# Patient Record
Sex: Female | Born: 1950 | Race: Black or African American | Hispanic: No | Smoking: Former smoker
Health system: Southern US, Community
[De-identification: ages and names within clinical notes are randomized; demographics above are authoritative.]

## PROBLEM LIST (undated history)

## (undated) ENCOUNTER — Inpatient Hospital Stay: Payer: Self-pay

## (undated) DIAGNOSIS — I272 Pulmonary hypertension, unspecified: Secondary | ICD-10-CM

## (undated) DIAGNOSIS — R002 Palpitations: Secondary | ICD-10-CM

## (undated) DIAGNOSIS — I10 Essential (primary) hypertension: Secondary | ICD-10-CM

## (undated) HISTORY — DX: Pulmonary hypertension, unspecified: I27.20

## (undated) HISTORY — DX: Essential (primary) hypertension: I10

## (undated) HISTORY — PX: OTHER SURGICAL HISTORY: SHX169

## (undated) HISTORY — DX: Palpitations: R00.2

---

## 2010-04-29 ENCOUNTER — Ambulatory Visit (INDEPENDENT_AMBULATORY_CARE_PROVIDER_SITE_OTHER): Payer: BC Managed Care – PPO | Admitting: Emergency Medicine

## 2010-04-29 DIAGNOSIS — R079 Chest pain, unspecified: Secondary | ICD-10-CM

## 2010-04-30 ENCOUNTER — Encounter: Payer: Self-pay | Admitting: Emergency Medicine

## 2010-04-30 DIAGNOSIS — R002 Palpitations: Secondary | ICD-10-CM | POA: Insufficient documentation

## 2010-04-30 LAB — CONVERTED CEMR LAB
ALT: 26 units/L (ref 0–35)
AST: 24 units/L (ref 0–37)
Albumin: 4 g/dL (ref 3.5–5.2)
Alkaline Phosphatase: 82 units/L (ref 39–117)
BUN: 12 mg/dL (ref 6–23)
Basophils Absolute: 0.1 10*3/uL (ref 0.0–0.1)
Basophils Relative: 1 % (ref 0–1)
CO2: 27 meq/L (ref 19–32)
Calcium: 9.7 mg/dL (ref 8.4–10.5)
Chloride: 106 meq/L (ref 96–112)
Creatinine, Ser: 0.8 mg/dL (ref 0.40–1.20)
Eosinophils Absolute: 0.3 10*3/uL (ref 0.0–0.7)
Eosinophils Relative: 3 % (ref 0–5)
Glucose, Bld: 93 mg/dL (ref 70–99)
HCT: 43.3 % (ref 36.0–46.0)
Hemoglobin: 13.9 g/dL (ref 12.0–15.0)
Lymphocytes Relative: 23 % (ref 12–46)
Lymphs Abs: 2.1 10*3/uL (ref 0.7–4.0)
MCHC: 32.1 g/dL (ref 30.0–36.0)
MCV: 88.9 fL (ref 78.0–100.0)
Monocytes Absolute: 0.6 10*3/uL (ref 0.1–1.0)
Monocytes Relative: 6 % (ref 3–12)
Neutro Abs: 6.3 10*3/uL (ref 1.7–7.7)
Neutrophils Relative %: 67 % (ref 43–77)
Platelets: 268 10*3/uL (ref 150–400)
Potassium: 5.2 meq/L (ref 3.5–5.3)
RBC: 4.87 M/uL (ref 3.87–5.11)
RDW: 13.6 % (ref 11.5–15.5)
Sodium: 142 meq/L (ref 135–145)
TSH: 2.62 microintl units/mL (ref 0.350–4.500)
Total Bilirubin: 0.3 mg/dL (ref 0.3–1.2)
Total Protein: 7.6 g/dL (ref 6.0–8.3)
WBC: 9.4 10*3/uL (ref 4.0–10.5)

## 2010-05-01 ENCOUNTER — Encounter: Payer: Self-pay | Admitting: Emergency Medicine

## 2010-05-09 NOTE — Progress Notes (Signed)
Summary: WORK NOTE  Office Visit   Imported By: Haskell Riling 05/01/2010 09:30:00  _____________________________________________________________________  External Attachment:    Type:   Image     Comment:   External Document

## 2010-05-09 NOTE — Progress Notes (Signed)
Summary: Office Visit  Office Visit   Imported By: Haskell Riling 05/01/2010 09:29:23  _____________________________________________________________________  External Attachment:    Type:   Image     Comment:   External Document

## 2010-05-09 NOTE — Progress Notes (Signed)
Summary: PRESCRIPTION  Office Visit   Imported By: Haskell Riling 05/01/2010 09:29:43  _____________________________________________________________________  External Attachment:    Type:   Image     Comment:   External Document

## 2010-05-14 DIAGNOSIS — I10 Essential (primary) hypertension: Secondary | ICD-10-CM | POA: Insufficient documentation

## 2010-05-15 ENCOUNTER — Ambulatory Visit: Payer: BC Managed Care – PPO | Admitting: Cardiology

## 2010-05-15 ENCOUNTER — Ambulatory Visit (INDEPENDENT_AMBULATORY_CARE_PROVIDER_SITE_OTHER): Payer: BC Managed Care – PPO | Admitting: Family Medicine

## 2010-05-15 ENCOUNTER — Encounter: Payer: Self-pay | Admitting: Family Medicine

## 2010-05-15 DIAGNOSIS — M940 Chondrocostal junction syndrome [Tietze]: Secondary | ICD-10-CM

## 2010-05-15 DIAGNOSIS — J069 Acute upper respiratory infection, unspecified: Secondary | ICD-10-CM

## 2010-05-15 NOTE — Assessment & Plan Note (Signed)
Summary: CHEST FLUTTER/WSE  History of Present Illness Chief Complaint: Palpitations History of Present Illness: EMR WAS NOT FUNCTIONING ON THE DAY PT SEEN.  PLEASE SEE HANDWRITTEN NOTES FOR HX,EXAM,A&P. SCANNED INTO EMR CHART.   Assessment New Problems: PALPITATIONS (ICD-785.1)  EKG WNL.  Also, pt has elevated BP,with relatively high pulse, will likely benefit from a beta blocker. Need to r/o metabolic cause.  Needs referral to cardiologist.  Patient Education: healthy eating habits. DASH diet.  Plan New Medications/Changes: ATENOLOL 25 MG TABS (ATENOLOL) 1 by mouth bid  #20 x 0, 05/05/2010, Lajean Manes MD  New Orders: T-CBC w/Diff [[id number]] T-CMP with Estimated GFR [[address]] T-TSH [44034-74259] New Patient Level IV [99204] Planning Comments:   See handwritten notes, scanned into chart   The patient and/or caregiver has been counseled thoroughly with regard to medications prescribed including dosage, schedule, interactions, rationale for use, and possible side effects and they verbalize understanding.  Diagnoses and expected course of recovery discussed and will return if not improved as expected or if the condition worsens. Patient and/or caregiver verbalized understanding.  Prescriptions: ATENOLOL 25 MG TABS (ATENOLOL) 1 by mouth bid  #20 x 0   Entered and Authorized by:   Lajean Manes MD   Signed by:   Lajean Manes MD on 05/05/2010   Method used:   Electronically to        [facility] [phone]* (retail)       [address] [place], [place]  [address]       Ph: [phone] or [phone]       Fax: [phone]   RxID:   [id number]   Orders Added: 1)  T-CBC w/Diff [[id number]] 2)  T-CMP with Estimated GFR [[address]] 3)  T-TSH [10626-94854] 4)  New Patient Level IV [62703]   Patient is scheduled to see Dr. Jens Som on May 15, 2010 @ 3:30 @ [facility] location. Patient notified. Lajean Saver RN  April 30, 2010 9:29 AM   CBC,  CMP, TSH Reviewed by me. All WNL.  I called pt at home number today to see how she was doing. Pt informed of normal lab results. She is taking the Atenolol 25 mg two times a day as prescribed without side-effects. The frequency of palpitations has decreased, although still getting them occasionally at rest. Denies chest pain. She is aware of Cardiology appt with Dr. Jens Som on May 15, 2010 @ 3:30 @ [facility] location. I advised her to keep that appt.  In the meantime, continue Atenolol 25 mg two times a day. (Today, I erx'd a RF for # 20 to last until appt with Dr. Jens Som). Advised to have BP checked where she works, and bring in a log of her BP's to the appt with Dr. Jens Som. Lajean Manes, MD (Urgent Care Physician at [facility]) - Februrary 12, 2012

## 2010-05-20 ENCOUNTER — Encounter: Payer: Self-pay | Admitting: Family Medicine

## 2010-05-20 ENCOUNTER — Ambulatory Visit
Admission: RE | Admit: 2010-05-20 | Discharge: 2010-05-20 | Disposition: A | Discharge status: Home / Self Care | Payer: BC Managed Care – PPO | Source: Ambulatory Visit | Attending: Family Medicine | Admitting: Family Medicine

## 2010-05-20 ENCOUNTER — Ambulatory Visit (INDEPENDENT_AMBULATORY_CARE_PROVIDER_SITE_OTHER): Payer: BC Managed Care – PPO | Admitting: Family Medicine

## 2010-05-20 ENCOUNTER — Other Ambulatory Visit: Payer: Self-pay | Admitting: Family Medicine

## 2010-05-20 DIAGNOSIS — R0609 Other forms of dyspnea: Secondary | ICD-10-CM

## 2010-05-20 DIAGNOSIS — R0989 Other specified symptoms and signs involving the circulatory and respiratory systems: Secondary | ICD-10-CM

## 2010-05-20 DIAGNOSIS — J069 Acute upper respiratory infection, unspecified: Secondary | ICD-10-CM

## 2010-05-21 NOTE — Letter (Signed)
Summary: Out of Work  [facility]  [address]   [place], [place] [address]   Phone: [phone]  Fax: [phone]    May 15, 2010   Employee:  [name]    To Whom It May Concern:   For Medical reasons, please excuse the above named employee from work today and tomorrow.  If you need additional information, please feel free to contact our office.         Sincerely,    Donna Christen MD

## 2010-05-21 NOTE — Assessment & Plan Note (Signed)
Summary: FEVER/SORE THROAT/SOB/HEADACHE rm 3   Vital Signs:  Patient Profile:   60 Years Old Female CC:      SOB, fever, HA Height:     68 inches Weight:      275 pounds O2 Sat:      91 % O2 treatment:    Room Air Temp:     100 degrees F oral Pulse rate:   90 / minute Resp:     24 per minute BP sitting:   189 / 89  (left arm) Cuff size:   large  Vitals Entered By: Clemens Catholic LPN (May 15, 2010 10:08 AM)                  Updated Prior Medication List: ATENOLOL 25 MG TABS (ATENOLOL) 1 by mouth bid  Current Allergies: No known allergies History of Present Illness Chief Complaint: SOB, fever, HA History of Present Illness:  Subjective: Patient complains of sore throat that started last week then resolved.  She then developed tightness in her anterior chest followed by a cough, now worse at night and non-productive.  She has had pneumonia in the distant past.   No pleuritic pain No wheezing + nasal congestion ? post-nasal drainage No sinus pain/pressure No itchy/red eyes No earache No hemoptysis + SOB with activity + fever/chills to 100.4 today No nausea No vomiting No abdominal pain No diarrhea No skin rashes + fatigue No myalgias + headache Used OTC meds without relief   REVIEW OF SYSTEMS Constitutional Symptoms       Complains of fever, chills, and night sweats.     Denies weight loss, weight gain, and fatigue.  Eyes       Complains of glasses.      Denies change in vision, eye pain, eye discharge, contact lenses, and eye surgery. Ear/Nose/Throat/Mouth       Complains of frequent runny nose and hoarseness.      Denies hearing loss/aids, change in hearing, ear pain, ear discharge, dizziness, frequent nose bleeds, sinus problems, sore throat, and tooth pain or bleeding.  Respiratory       Complains of dry cough, wheezing, and shortness of breath.      Denies productive cough, asthma, bronchitis, and emphysema/COPD.  Cardiovascular  Complains of chest pain and tires easily with exhertion.      Denies murmurs.    Gastrointestinal       Denies stomach pain, nausea/vomiting, diarrhea, constipation, blood in bowel movements, and indigestion.      Comments: vommiting Genitourniary       Denies painful urination, kidney stones, and loss of urinary control. Neurological       Denies paralysis, seizures, and fainting/blackouts. Musculoskeletal       Denies muscle pain, joint pain, joint stiffness, decreased range of motion, redness, swelling, muscle weakness, and gout.  Skin       Denies bruising, unusual mles/lumps or sores, and hair/skin or nail changes.  Psych       Denies mood changes, temper/anger issues, anxiety/stress, speech problems, depression, and sleep problems. Other Comments: pt c/o fever, SOB, productive cough, chills, and fatigue x 1wk. she states that she was feeling better over the wkend and came back again on Monday.   Past History:  Past Medical History: Reviewed history from 05/14/2010 and no changes required. HYPERTENSION  PALPITATIONS  Past Surgical History: none  Family History: Reviewed history from 05/14/2010 and no changes required. Sisiter : murmur  Mother: enlarged heart  Social History: Reviewed  history and no changes required. Never Smoked Alcohol use-no Drug use-no Smoking Status:  never Drug Use:  no   Objective:  No acute distress  Eyes:  Pupils are equal, round, and reactive to light and accomdation.  Extraocular movement is intact.  Conjunctivae are not inflamed.  Ears:  Canals normal.  Tympanic membranes normal.   Nose:  Normal septum.  Normal turbinates, mildly congested.   No sinus tenderness present.  Pharynx:  Normal  Neck:  Supple.   Tender shotty posterior nodes are palpated bilaterally.  Lungs:  Clear to auscultation.  Breath sounds are equal.  Chest:  Distinct tenderness over sternum Heart:  Regular rate and rhythm without murmurs, rubs, or gallops.    Abdomen:  Nontender without masses or hepatosplenomegaly.  Bowel sounds are present.  No CVA or flank tenderness.  Extremities:  No edema.  Skin:  no rash Assessment New Problems: COSTOCHONDRITIS, ACUTE (ICD-733.6) UPPER RESPIRATORY INFECTION, ACUTE (ICD-465.9)  URI WITH COSTOCHONDRITIS  Plan New Medications/Changes: BENZONATATE 200 MG CAPS (BENZONATATE) One by mouth hs as needed cough  #12 x 0, 05/15/2010, Donna Christen MD AMOXICILLIN 875 MG TABS (AMOXICILLIN) One by mouth two times a day  #20 x 0, 05/15/2010, Donna Christen MD  New Orders: Pulse Oximetry (single measurment) [94760] Est. Patient Level III [16109] Planning Comments:   With history of pneumonia will begin amoxicillin Begin expectorant, topical decongestant, cough suppressant at bedtime.  Increase fluid intake.  Begin ibuprofen for chest discomfort Followup with PCP if not improving 7 to 10 days   The patient and/or caregiver has been counseled thoroughly with regard to medications prescribed including dosage, schedule, interactions, rationale for use, and possible side effects and they verbalize understanding.  Diagnoses and expected course of recovery discussed and will return if not improved as expected or if the condition worsens. Patient and/or caregiver verbalized understanding.  Prescriptions: BENZONATATE 200 MG CAPS (BENZONATATE) One by mouth hs as needed cough  #12 x 0   Entered and Authorized by:   Donna Christen MD   Signed by:   Donna Christen MD on 05/15/2010   Method used:   Print then Give to Patient   RxID:   [id number] AMOXICILLIN 875 MG TABS (AMOXICILLIN) One by mouth two times a day  #20 x 0   Entered and Authorized by:   Donna Christen MD   Signed by:   Donna Christen MD on 05/15/2010   Method used:   Print then Give to Patient   RxID:   [id number]   Patient Instructions: 1)  Take Mucinex  (guaifenesint) twice daily for congestion. 2)  Increase fluid intake, rest. 3)  May use Afrin  nasal spray (or generic oxymetazoline) twice daily for about 5 days.  Also recommend using saline nasal spray several times daily and/or saline nasal irrigation. 4)  May take Ibuprofen for chest discomfort  5)  Followup with family doctor if not improving 7 to 10 days.   Orders Added: 1)  Pulse Oximetry (single measurment) [94760] 2)  Est. Patient Level III [29528]

## 2010-05-23 ENCOUNTER — Encounter: Payer: Self-pay | Admitting: Emergency Medicine

## 2010-05-27 ENCOUNTER — Ambulatory Visit (INDEPENDENT_AMBULATORY_CARE_PROVIDER_SITE_OTHER): Payer: BC Managed Care – PPO | Admitting: Emergency Medicine

## 2010-05-27 ENCOUNTER — Encounter: Payer: Self-pay | Admitting: Emergency Medicine

## 2010-05-27 DIAGNOSIS — R0989 Other specified symptoms and signs involving the circulatory and respiratory systems: Secondary | ICD-10-CM

## 2010-05-30 NOTE — Assessment & Plan Note (Signed)
Summary: SHORTNESS OF BREATH,HIGH BLOOD PRESSURE/WB (rm 4)   Vital Signs:  Patient Profile:   60 Years Old Female CC:      SOB, HA, unchanged symptoms from last visit Height:     68 inches Weight:      275 pounds O2 Sat:      90 % O2 treatment:    Room Air Temp:     99.0 degrees F oral Pulse rate:   95 / minute Resp:     30 per minute BP sitting:   187 / 92  (left arm) Cuff size:   large  Vitals Entered By: Lajean Saver RN (May 20, 2010 9:20 AM)                  Updated Prior Medication List: ATENOLOL 25 MG TABS (ATENOLOL) 1 by mouth bid AMOXICILLIN 875 MG TABS (AMOXICILLIN) One by mouth two times a day BENZONATATE 200 MG CAPS (BENZONATATE) One by mouth hs as needed cough  Current Allergies: No known allergies History of Present Illness Chief Complaint: SOB, HA, unchanged symptoms from last visit History of Present Illness:  Subjective:  Since her previous visit, patient reports that her cough has increased somewhat but is now productive of clear mucous, and is somewhat worse at night.   She has had an increase in dyspenea on exertion.  No pleuritic pain.  No fevers, chills, and sweats.  She does not believe that she has had any unusual leg swelling.  She notes that her dyspnea becomes somewhat worse after drinking water.  She reports that her BP has increased recently.  REVIEW OF SYSTEMS Constitutional Symptoms       Complains of fatigue.     Denies fever, chills, night sweats, weight loss, and weight gain.  Eyes       Denies change in vision, eye pain, eye discharge, glasses, contact lenses, and eye surgery. Ear/Nose/Throat/Mouth       Complains of sinus problems.      Denies hearing loss/aids, change in hearing, ear pain, ear discharge, dizziness, frequent runny nose, frequent nose bleeds, sore throat, hoarseness, and tooth pain or bleeding.  Respiratory       Complains of dry cough and shortness of breath.      Denies productive cough, wheezing, asthma,  bronchitis, and emphysema/COPD.  Cardiovascular       Complains of chest pain.      Denies murmurs and tires easily with exhertion.    Gastrointestinal       Denies stomach pain, nausea/vomiting, diarrhea, constipation, blood in bowel movements, and indigestion. Genitourniary       Denies painful urination, kidney stones, and loss of urinary control. Neurological       Complains of headaches.      Denies paralysis, seizures, and fainting/blackouts. Musculoskeletal       Denies muscle pain, joint pain, joint stiffness, decreased range of motion, redness, swelling, muscle weakness, and gout.  Skin       Denies bruising, unusual mles/lumps or sores, and hair/skin or nail changes.  Psych       Complains of anxiety/stress.      Denies mood changes, temper/anger issues, speech problems, depression, and sleep problems. Other Comments: Patient was seen on 05-15-10. She says her symptoms are unchanged and her SOB has slightly worsened. She gets very SOB with minimal exertion. RR 30, 02sat 90%. She is tearful because she can't work in her condition and needs to work. Given family medicine card to  establish with a PCP. She says she will call them today   Past History:  Past Medical History: Reviewed history from 05/14/2010 and no changes required. HYPERTENSION  PALPITATIONS  Past Surgical History: Reviewed history from 05/15/2010 and no changes required. none  Family History: Reviewed history from 05/14/2010 and no changes required. Sisiter : murmur  Mother: enlarged heart  Social History: quit smoking. 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  Extraocular movement is intact.  Conjunctivae are not inflamed.  Ears:  Canals normal.  Tympanic membranes normal.   Nose:  Minimal  congestion Pharynx:  Normal  Neck:  Supple.  No adenopathy is present.  Lungs:  Clear to auscultation.  Breath sounds are equal.  Heart:  Regular rate and rhythm without murmurs, rubs, or gallops.  Abdomen:  Obese, nontender without masses or hepatosplenomegaly.   Extremities:  Trace pitting edema CBC:  WBC 10.1; nl diff; Hgb 13.9 Chest X-ray:  IMPRESSION: Somewhat prominent interstitial markings may well be chronic in nature but an active process is difficult to exclude.  Recommend either comparison with prior chest x-ray if available versus follow- up chest x-ray to ensure stability. Assessment  Assessed HYPERTENSION as deteriorated - Donna Christen MD New Problems: DYSPNEA ON EXERTION (ICD-786.09)  SUSPECT EARLY CHF HTN POOR CONTROL  Plan New Medications/Changes: HYDROCHLOROTHIAZIDE 25 MG TABS (HYDROCHLOROTHIAZIDE) One by mouth qam  #30 x 0, 05/20/2010, Donna Christen MD  New Orders: T-Chest x-ray, 2 views [71020] Pulse Oximetry (single measurment) [94760] CBC w/Diff [16109-60454] Est. Patient Level IV [09811] Planning Comments:   Begin HCTZ 25mg  once each morning.  Continue atenolol.  Advised to weigh daily and record. Avoid sodium. Follow-up with PCP within one week (or return here if appt not available).  Return for worsening symptoms.    The patient and/or caregiver has been counseled thoroughly with regard to medications prescribed including dosage, schedule, interactions, rationale for use, and possible side effects and they verbalize understanding.  Diagnoses and expected course of recovery discussed and will return if not improved as expected or if the condition worsens. Patient and/or caregiver verbalized understanding.  Prescriptions: HYDROCHLOROTHIAZIDE 25 MG TABS (HYDROCHLOROTHIAZIDE) One by mouth qam  #30 x 0   Entered and Authorized by:   Donna Christen MD   Signed by:   Donna Christen MD on 05/20/2010   Method used:   Print then Give to Patient   RxID:    [id number]   Orders Added: 1)  T-Chest x-ray, 2 views [71020] 2)  Pulse Oximetry (single measurment) [94760] 3)  CBC w/Diff [78469-62952] 4)  Est. Patient Level IV [84132]

## 2010-05-30 NOTE — Letter (Signed)
Summary: Out of Work  [facility]  [address]   [place], [place] [address]   Phone: [phone]  Fax: [phone]    May 20, 2010   Employee:  Olegario Messier Lederman    To Whom It May Concern:   For Medical reasons, please excuse the above named employee from work today and tomorrow.      If you need additional information, please feel free to contact our office.         Sincerely,    Donna Christen MD

## 2010-06-03 ENCOUNTER — Encounter: Payer: Self-pay | Admitting: Family Medicine

## 2010-06-04 NOTE — Assessment & Plan Note (Signed)
Summary: FOLLOW UP PER DR B rm 4   Vital Signs:  Patient Profile:   60 Years Old Female CC:      follow up SOB and blood pressure Height:     68 inches Weight:      267 pounds O2 Sat:      96 % O2 treatment:    Room Air Temp:     98.7 degrees F oral Pulse rate:   91 / minute Resp:     18 per minute BP sitting:   160 / 98  (left arm) Cuff size:   large  Vitals Entered By: Clemens Catholic LPN (May 26, 1608 9:36 AM)                  Updated Prior Medication List: ATENOLOL 25 MG TABS (ATENOLOL) 1 by mouth bid HYDROCHLOROTHIAZIDE 25 MG TABS (HYDROCHLOROTHIAZIDE) One by mouth qam  Current Allergies (reviewed today): No known allergies History of Present Illness Chief Complaint: follow up SOB and blood pressure History of Present Illness: Patient is feeling much better today, better energy, less fatigue, no SOB, no CP.  She is taking her HCTZ but ran out of her Atenolol.  She has an appt with Dr. Linford Arnold in 3 days to establish.  REVIEW OF SYSTEMS Constitutional Symptoms      Denies fever, chills, night sweats, weight loss, weight gain, and fatigue.  Eyes       Denies change in vision, eye pain, eye discharge, glasses, contact lenses, and eye surgery. Ear/Nose/Throat/Mouth       Complains of sore throat.      Denies hearing loss/aids, change in hearing, ear pain, ear discharge, dizziness, frequent runny nose, frequent nose bleeds, sinus problems, hoarseness, and tooth pain or bleeding.  Respiratory       Complains of dry cough, wheezing, and shortness of breath.      Denies productive cough, asthma, bronchitis, and emphysema/COPD.  Cardiovascular       Complains of chest pain.      Denies murmurs and tires easily with exhertion.    Gastrointestinal       Denies stomach pain, nausea/vomiting, diarrhea, constipation, blood in bowel movements, and indigestion. Genitourniary       Denies painful urination, kidney stones, and loss of urinary control. Neurological  Complains of headaches.      Denies paralysis, seizures, and fainting/blackouts. Musculoskeletal       Denies muscle pain, joint pain, joint stiffness, decreased range of motion, redness, swelling, muscle weakness, and gout.  Skin       Denies bruising, unusual mles/lumps or sores, and hair/skin or nail changes.  Psych       Denies mood changes, temper/anger issues, anxiety/stress, speech problems, depression, and sleep problems. Other Comments: The pt is here today for a F/U of her blood pressure and SOB. She states that she feels better, weight has come down. she ran out of Atenolol on friday. she states that she does not have a PCP and has been unable to get sch'ed. We are going to sch appt for the pt with a PCP.   Past History:  Past Medical History: Reviewed history from 05/14/2010 and no changes required. HYPERTENSION  PALPITATIONS  Past Surgical History: Reviewed history from 05/15/2010 and no changes required. none  Family History: Reviewed history from 05/14/2010 and no changes required. Sisiter : murmur  Mother: enlarged heart  Social History: Reviewed history from 05/20/2010 and no changes required. quit smoking. 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 General appearance: well developed, well nourished, no acute distress Ears: normal, no lesions or deformities Nasal: mucosa pink, nonedematous, no septal deviation, turbinates normal Oral/Pharynx: tongue normal, posterior pharynx without erythema or exudate Chest/Lungs: no rales, wheezes, or rhonchi bilateral, breath sounds equal without effort Heart: regular rate and  rhythm, no murmur Extremities: no peripheral edema MSE: oriented to time, place, and person  Plan New Medications/Changes: ATENOLOL 25 MG TABS (ATENOLOL) 1 by mouth two times a day  #60 x 3, 05/27/2010, Hoyt Koch MD  New Orders: Est. Patient Level III [16109] Pulse Oximetry (single  measurment) [60454] Planning Comments:   Will refill Atenolol, but will defer to Dr. Linford Arnold for what antihypertensives she'd like the patient on.  Encourage diet and weight loss.  Please talk to your doctor about that.  If any worsening of symptoms, contact a physician.   The patient and/or caregiver has been counseled thoroughly with regard to medications prescribed including dosage, schedule, interactions, rationale for use, and possible side effects and they verbalize understanding.  Diagnoses and expected course of recovery discussed and will return if not improved as expected or if the condition worsens. Patient and/or caregiver verbalized understanding.  Prescriptions: ATENOLOL 25 MG TABS (ATENOLOL) 1 by mouth two times a day  #60 x 3   Entered and Authorized by:   Hoyt Koch MD   Signed by:   Hoyt Koch MD on 05/27/2010   Method used:   Print then Give to Patient   RxID:   [id number]   Orders Added: 1)  Est. Patient Level III [30865] 2)  Pulse Oximetry (single measurment) [78469]

## 2010-06-05 ENCOUNTER — Ambulatory Visit (INDEPENDENT_AMBULATORY_CARE_PROVIDER_SITE_OTHER): Payer: BC Managed Care – PPO | Admitting: Cardiology

## 2010-06-05 ENCOUNTER — Encounter: Payer: Self-pay | Admitting: Cardiology

## 2010-06-05 DIAGNOSIS — I10 Essential (primary) hypertension: Secondary | ICD-10-CM

## 2010-06-05 DIAGNOSIS — R002 Palpitations: Secondary | ICD-10-CM

## 2010-06-06 ENCOUNTER — Encounter: Payer: Self-pay | Admitting: Family Medicine

## 2010-06-06 ENCOUNTER — Ambulatory Visit (INDEPENDENT_AMBULATORY_CARE_PROVIDER_SITE_OTHER): Payer: BC Managed Care – PPO | Admitting: Family Medicine

## 2010-06-06 DIAGNOSIS — I10 Essential (primary) hypertension: Secondary | ICD-10-CM

## 2010-06-06 DIAGNOSIS — Z23 Encounter for immunization: Secondary | ICD-10-CM

## 2010-06-07 LAB — CONVERTED CEMR LAB
ALT: 22 units/L (ref 0–35)
AST: 22 units/L (ref 0–37)
Albumin: 4.3 g/dL (ref 3.5–5.2)
Alkaline Phosphatase: 71 units/L (ref 39–117)
Glucose, Bld: 78 mg/dL (ref 70–99)
LDL Cholesterol: 74 mg/dL (ref 0–99)
Potassium: 4 meq/L (ref 3.5–5.3)
Sodium: 137 meq/L (ref 135–145)
Total Bilirubin: 0.4 mg/dL (ref 0.3–1.2)
Total Protein: 7.7 g/dL (ref 6.0–8.3)
Triglycerides: 66 mg/dL (ref <150)
VLDL: 13 mg/dL (ref 0–40)

## 2010-06-11 NOTE — Assessment & Plan Note (Signed)
Summary: PALPITATIONS/HTN/URGENT CARE REF DR MASSEY   Visit Type:  Initial Consult  CC:  palpitations.  History of Present Illness: 60 year old female for evaluation of palpitations. No prior cardiac history. TSH, hemoglobin and potassium normal in February 2012. Patient recently had a URI treated with antibiotics. She had dyspnea and a productive cough. She also developed palpitations. They are described as a skipped in "a hardbbeat". They are not sustained. There's been no presyncope or syncope. She also had some pedal edema. Her blood pressure was also increased. HCTZ was added and her pedal edema has resolved. Her dyspnea has also resolved after being treated with antibiotics. She now denies dyspnea on exertion, orthopnea, PND, pedal edema, chest pain. She continues to have occasional skip. Because of the above we were asked to further evaluate.  Current Medications (verified): 1)  Hydrochlorothiazide 25 Mg Tabs (Hydrochlorothiazide) .... One By Mouth Qam 2)  Atenolol 25 Mg Tabs (Atenolol) .Marland Kitchen.. 1 By Mouth Two Times A Day  Allergies (verified): No Known Drug Allergies  Past History:  Past Medical History: HYPERTENSION   Past Surgical History: Reviewed history from 06/04/2010 and no changes required. none  Family History: Reviewed history from 06/04/2010 and no changes required. Sisiter : murmur Mother: enlarged heart Father with prostate cancer  Social History: quit smoking. 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 Remaining systems are negative.   Vital Signs:  Patient profile:   60 year old female Height:      68 inches Weight:      267.50 pounds BMI:     40.82 Pulse rate:   68 / minute Pulse rhythm:    regular Resp:     18 per minute BP sitting:   168 / 84  (left arm) Cuff size:   large  Vitals Entered By: Vikki Ports (June 05, 2010 3:11 PM)  Physical Exam  General:  Well developed/obese in NAD Skin warm/dry Patient not depressed No peripheral clubbing Back-normal HEENT-normal/normal eyelids Neck supple/normal carotid upstroke bilaterally; no bruits; no JVD; no thyromegaly chest - CTA/ normal expansion CV - RRR/normal S1 and S2; no murmurs, rubs or gallops;  PMI nondisplaced Abdomen -NT/ND, no HSM, no mass, + bowel sounds, no bruit 2+ femoral pulses, no bruits Ext-no edema, chords, 2+ DP Neuro-grossly nonfocal     EKG  Procedure date:  05/05/2010  Findings:      Sinus rhythm at a rate of 94. No ST changes.  Impression & Recommendations:  Problem # 1:  PALPITATIONS (ICD-785.1) Description sounds to be most likely Pvcs. This occurred in the setting of a URI. I will increase her atenolol to 50 mg p.o. b.i.d. If her symptoms persist after resolution of her URI and on increased beta blocker we will consider a monitor. She will contact us if her symptoms do not improve. Check echocardiogram. Note TSH and potassium normal. The following medications were removed from the medication list:    Atenolol 25 Mg Tabs (Atenolol) .Marland Kitchen... 1 by mouth bid Her updated medication list for this problem includes:    Atenolol 50 Mg Tabs (Atenolol) .Marland Kitchen... Take one tablet by mouth twice a day  Problem # 2:  HYPERTENSION (ICD-401.9) Blood pressure elevated. Increase atenolol as described above. The following medications were removed from the medication list:  Atenolol 25 Mg Tabs (Atenolol) .Marland Kitchen... 1 by mouth bid Her updated medication list for this problem includes:    Hydrochlorothiazide 25 Mg Tabs (Hydrochlorothiazide) ..... One by mouth qam    Atenolol 50 Mg Tabs (Atenolol) .Marland Kitchen... Take one tablet by mouth twice a day  Problem # 3:  DYSPNEA ON EXERTION (ICD-786.09) Symptoms have  resolved. The following medications were removed from the medication list:    Atenolol 25 Mg Tabs (Atenolol) .Marland Kitchen... 1 by mouth bid Her updated medication list for this problem includes:    Hydrochlorothiazide 25 Mg Tabs (Hydrochlorothiazide) ..... One by mouth qam    Atenolol 50 Mg Tabs (Atenolol) .Marland Kitchen... Take one tablet by mouth twice a day  Orders: Echocardiogram (Echo)  Patient Instructions: 1)  Your physician has recommended you make the following change in your medication: INCREASE ATENOLOL TO 50MG  ONE TABLET TWICE DAILY 2)  Your physician has requested that you have an echocardiogram.  Echocardiography is a painless test that uses sound waves to create images of your heart. It provides your doctor with information about the size and shape of your heart and how well your heart's chambers and valves are working.  This procedure takes approximately one hour. There are no restrictions for this procedure. Prescriptions: ATENOLOL 50 MG TABS (ATENOLOL) Take one tablet by mouth twice a day  #60 x 12   Entered by:   Deliah Goody, RN   Authorized by:   Ferman Hamming, MD, [facility]   Signed by:   Deliah Goody, RN on 06/05/2010   Method used:   Electronically to        [facility] [phone][facility] (retail)       [address] [place], [place]  [address]       Ph: [phone] or [phone]       Fax: [phone]   RxID:   [id number]

## 2010-06-11 NOTE — Assessment & Plan Note (Signed)
Summary: New patient: HTN   Vital Signs:  Patient profile:   60 year old female Height:      68 inches Weight:      268 pounds Pulse rate:   89 / minute BP sitting:   141 / 72  (right arm) Cuff size:   large  Vitals Entered By: Avon Gully CMA, (AAMA) (June 06, 2010 10:10 AM) CC: NP-est care   CC:  NP-est care.  Habits & Providers  Alcohol-Tobacco-Diet     Alcohol drinks/day: 0     Tobacco Status: quit     Year Quit: 2002  Exercise-Depression-Behavior     Does Patient Exercise: no     STD Risk: never     Drug Use: never     Seat Belt Use: always  Current Medications (verified): 1)  Hydrochlorothiazide 25 Mg Tabs (Hydrochlorothiazide) .... One By Mouth Qam 2)  Atenolol 50 Mg Tabs (Atenolol) .... Take One Tablet By Mouth Twice A Day 3)  Cod Liver Oil  Oil (Cod Liver Oil) 4)  Vitamin D3 400 Unit Tabs (Cholecalciferol)  Allergies (verified): No Known Drug Allergies  Comments:  Nurse/Medical Assistant: The patient's medications and allergies were reviewed with the patient and were updated in the Medication and Allergy Lists. Avon Gully CMA, Duncan Dull) (June 06, 2010 10:13 AM)  Past History:  Past Medical History: HYPERTENSION  - Dr. 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 Smoking Status:  quit Does Patient Exercise:  no STD Risk:  never Drug Use:  never Seat Belt Use:  always  Review of Systems       No fever/sweats/weakness, unexplained weight loss/gain.  No vison changes.  No difficulty hearing/ringing in ears, hay fever/allergies.  No chest pain/discomfort, palpitations.  No Br lump/nipple discharge.  No cough/wheeze.  No blood in BM, nausea/vomiting/diarrhea.  No nighttime  urination, leaking urine, unusual vaginal bleeding, discharge (penis or vagina).  No muscle/joint pain. No rash, change in mole.  No HA, memory loss.  No anxiety, sleep d/o, depression.  No easy bruising/bleeding, unexplained lump   Physical Exam  General:  Well-developed,well-nourished,in no acute distress; alert,appropriate and cooperative throughout examination Head:  Normocephalic and atraumatic without obvious abnormalities. No apparent alopecia or balding. Neck:  No deformities, masses, or tenderness noted. NO tM.  Lungs:  Normal respiratory effort, chest expands symmetrically. Lungs are clear to auscultation, no crackles or wheezes. Heart:  Normal rate and regular rhythm. S1 and S2 normal without gallop, murmur, click, rub or other extra sounds. No carotid bruits.  Psych:  Cognition and judgment appear intact. Alert and cooperative with normal attention span and concentration. No apparent delusions, illusions, hallucinations   Impression & Recommendations:  Problem # 1:  HYPERTENSION (ICD-401.9) Discussed decreasing salt intake. She really struggles with this.   Her BP already likes better today on the increased atenolol F/U in one month Due for screening lipds, cr, K, etc.    Her updated medication list for this problem includes:    Hydrochlorothiazide 25 Mg Tabs (Hydrochlorothiazide) ..... One by mouth qam    Atenolol 50 Mg Tabs (Atenolol) .Marland Kitchen... Take one tablet by mouth twice a day  Orders: T-Lipid Profile ([id number]) T-Comprehensive Metabolic Panel [phone])  Complete Medication List: 1)  Hydrochlorothiazide 25 Mg Tabs (Hydrochlorothiazide) .... One by mouth qam 2)  Atenolol 50 Mg Tabs (Atenolol) .... Take one tablet by mouth twice a day 3)  Cod Liver Oil Oil (Cod liver oil) 4)  Vitamin D3 400 Unit Tabs (Cholecalciferol)  Other Orders: Gastroenterology Referral (GI) Tdap => 87yrs IM (44010) Admin 1st Vaccine (27253)  Patient Instructions: 1)  It is important  that you exercise reguarly at least 20 minutes 5 times a week. If you develop chest pain, have severe difficulty breathing, or feel very tired, stop exercising immediately and seek medical attention.  2)  Low salt diet 3)  Please schedule a follow-up appointment in 1 month for blood pressure.    Orders Added: 1)  T-Lipid Profile [[id number]] 2)  T-Comprehensive Metabolic Panel [[id number]] 3)  Gastroenterology Referral [GI] 4)  Tdap => 48yrs IM [90715] 5)  Admin 1st Vaccine [90471] 6)  New Patient Level III [66440]   Immunizations Administered:  Tetanus Vaccine:    Vaccine Type: Tdap    Site: right deltoid    Mfr: GlaxoSmithKline    Dose: 0.5 ml    Route: IM    Given by: Sue Lush McCrimmon CMA, (AAMA)    Exp. Date: 01/11/2012    Lot #: [id number]    VIS given: 02/09/08 version given June 06, 2010.   Immunizations Administered:  Tetanus Vaccine:    Vaccine Type: Tdap    Site: right deltoid    Mfr: GlaxoSmithKline    Dose: 0.5 ml    Route: IM    Given by: Sue Lush McCrimmon CMA, (AAMA)    Exp. Date: 01/11/2012    Lot #: [id number]    VIS given: 02/09/08 version given June 06, 2010.

## 2010-06-20 NOTE — Letter (Signed)
Summary: Patient Information form  Patient Information form   Imported By: Maryln Gottron 06/10/2010 15:24:30  _____________________________________________________________________  External Attachment:    Type:   Image     Comment:   External Document

## 2010-06-20 NOTE — Letter (Signed)
Summary: Registration, Billing Information  Registration, Billing Information   Imported By: Maryln Gottron 06/10/2010 15:25:45  _____________________________________________________________________  External Attachment:    Type:   Image     Comment:   External Document

## 2010-06-21 ENCOUNTER — Other Ambulatory Visit (HOSPITAL_COMMUNITY): Payer: BC Managed Care – PPO | Admitting: Radiology

## 2010-07-01 ENCOUNTER — Telehealth: Payer: Self-pay | Admitting: *Deleted

## 2010-07-01 NOTE — Telephone Encounter (Signed)
Date: 06/28/2010 12:00:00 AM Time of Call: [phone] Faxed To: [place] [facility] Fax Number: [phone] Facility: N/A Patient: Annette Duncan, Annette Duncan DOB: 21-Aug-1950 Phone: [phone] Provider: Nani Gasser Message: See info below Regarding Appointment: Yes Appt Date: 07/02/2010 Appt Time: 10:00:00 AM Provider: Nani Gasser Reason: Details: Unable to make appointment, will be out of town. Would like to cancel appointment. Instructed to call Monday in the AM. Outcome: Instructed patient to call back on the next business day. Message Taken by: Corinna Lines, CSR [facility]  [address] [place], [place] [address]  P: [phone]  F: [phone]

## 2010-09-03 ENCOUNTER — Encounter: Payer: Self-pay | Admitting: Cardiology

## 2010-09-10 ENCOUNTER — Inpatient Hospital Stay (INDEPENDENT_AMBULATORY_CARE_PROVIDER_SITE_OTHER)
Admission: RE | Admit: 2010-09-10 | Discharge: 2010-09-10 | Disposition: A | Discharge status: Home / Self Care | Payer: BC Managed Care – PPO | Source: Ambulatory Visit | Attending: Family Medicine | Admitting: Family Medicine

## 2010-09-10 ENCOUNTER — Encounter: Payer: Self-pay | Admitting: Family Medicine

## 2010-09-10 DIAGNOSIS — M940 Chondrocostal junction syndrome [Tietze]: Secondary | ICD-10-CM

## 2010-09-10 DIAGNOSIS — J069 Acute upper respiratory infection, unspecified: Secondary | ICD-10-CM

## 2010-09-10 DIAGNOSIS — R0602 Shortness of breath: Secondary | ICD-10-CM

## 2010-09-13 ENCOUNTER — Telehealth (INDEPENDENT_AMBULATORY_CARE_PROVIDER_SITE_OTHER): Payer: Self-pay | Admitting: *Deleted

## 2010-09-23 ENCOUNTER — Telehealth: Payer: Self-pay | Admitting: Family Medicine

## 2010-09-23 MED ORDER — HYDROCHLOROTHIAZIDE 25 MG PO TABS: 25.0000 mg | ORAL_TABLET | Freq: Every day | ORAL | Status: DC

## 2010-09-23 NOTE — Telephone Encounter (Signed)
Closed

## 2010-09-27 ENCOUNTER — Encounter: Payer: Self-pay | Admitting: Family Medicine

## 2010-09-27 ENCOUNTER — Ambulatory Visit (INDEPENDENT_AMBULATORY_CARE_PROVIDER_SITE_OTHER): Payer: BC Managed Care – PPO | Admitting: Family Medicine

## 2010-09-27 DIAGNOSIS — R0609 Other forms of dyspnea: Secondary | ICD-10-CM

## 2010-09-27 DIAGNOSIS — I10 Essential (primary) hypertension: Secondary | ICD-10-CM

## 2010-09-27 MED ORDER — HYDROCHLOROTHIAZIDE 25 MG PO TABS: 25.0000 mg | ORAL_TABLET | Freq: Every day | ORAL | Status: DC

## 2010-09-27 NOTE — Assessment & Plan Note (Signed)
I was hoping her dyspnea with exertion would resolve as we were able to bring her blood pressure down. I think overall she is better but it still persisted. I would like her to call Dr. Ludwig Clarks office and get scheduled for the further testing that he had originally prescribed. Her thyroid was normal in February. If she is not a smoker which makes her low risk for pulmonary disease. If she has a negative cardiac workup we can consider further evaluation with spirometry and chest x-ray.

## 2010-09-27 NOTE — Progress Notes (Signed)
  Subjective:    Patient ID: Annette Duncan, female    DOB: [birth year], 60 y.o.   MRN: [id number]  Hypertension This is a chronic problem. The current episode started more than 1 year ago. The problem is unchanged. Pertinent negatives include no blurred vision, chest pain or shortness of breath. There are no associated agents to hypertension. Risk factors for coronary artery disease include no known risk factors. Past treatments include diuretics. The current treatment provides mild improvement. There are no compliance problems.   Out of fluid pill since March until last week. She is back on it now and feels so much better. Less SOB and her weight has come down.   She feels her "chest gets congested" when she doesn't take her fluid pill. Seh says she has really been trying to watch the salt in her diet.   Has to sit down at work after doing somethin. Will get hot and sweaty and then cold chills and will feel SOB.  Did see Dr. Jens Som who had recommened further testing but she hasn't set this up yet.  Mother has a hx of heart problems(an enlarged heart).  .    Review of Systems  Eyes: Negative for blurred vision.  Respiratory: Negative for shortness of breath.   Cardiovascular: Negative for chest pain.       Objective:   Physical Exam  Constitutional: She is oriented to person, place, and time. She appears well-developed and well-nourished.  HENT:  Head: Normocephalic and atraumatic.  Neck: Neck supple. No thyromegaly present.  Cardiovascular: Normal rate, regular rhythm and normal heart sounds.        No carotid bruits  Pulmonary/Chest: Effort normal and breath sounds normal.  Musculoskeletal: She exhibits no edema.  Neurological: She is alert and oriented to person, place, and time.  Skin: Skin is warm and dry.  Psychiatric: She has a normal mood and affect.          Assessment & Plan:

## 2010-09-27 NOTE — Assessment & Plan Note (Signed)
At goal. Her blood pressure looks absolutely fantastic today. I congratulated her on her gradual weight loss and the changes she has made in her diet. I also think her restart her hydrochlorothiazide has been helpful as well. I gave her a new prescription for 6 months worth. She says she has refills on her current medications. I went to see her back in 6 months.

## 2010-10-16 ENCOUNTER — Telehealth: Payer: Self-pay | Admitting: Cardiology

## 2010-10-16 NOTE — Telephone Encounter (Signed)
Pt returning call to [facility]. Unable to locate Stanton Kidney to transfer call. Please return pt call.

## 2010-10-16 NOTE — Telephone Encounter (Signed)
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 Some days there are no symptoms. Tightness and sob happen at the same time. Pt is wanting to get appt w/ Dr. Jens Som as soon as possible to potentially run some tests to determine the cause or sob and chest tightness. Please return pt call to advise/discuss.   Spoke with Deliah Goody and was instructed to take a message for Stanton Kidney to return call to pt.

## 2010-10-22 NOTE — Telephone Encounter (Signed)
Left message for pt to call regarding the call from last week Annette Duncan

## 2010-11-26 ENCOUNTER — Encounter: Payer: Self-pay | Admitting: Emergency Medicine

## 2010-11-26 ENCOUNTER — Inpatient Hospital Stay (INDEPENDENT_AMBULATORY_CARE_PROVIDER_SITE_OTHER)
Admission: RE | Admit: 2010-11-26 | Discharge: 2010-11-26 | Disposition: A | Discharge status: Home / Self Care | Payer: BC Managed Care – PPO | Source: Ambulatory Visit | Attending: Emergency Medicine | Admitting: Emergency Medicine

## 2010-11-26 ENCOUNTER — Encounter (INDEPENDENT_AMBULATORY_CARE_PROVIDER_SITE_OTHER): Payer: Self-pay | Admitting: *Deleted

## 2010-11-26 DIAGNOSIS — R079 Chest pain, unspecified: Secondary | ICD-10-CM

## 2010-11-27 ENCOUNTER — Telehealth: Payer: Self-pay | Admitting: Cardiology

## 2010-11-27 ENCOUNTER — Ambulatory Visit (INDEPENDENT_AMBULATORY_CARE_PROVIDER_SITE_OTHER): Payer: BC Managed Care – PPO | Admitting: Cardiology

## 2010-11-27 ENCOUNTER — Encounter: Payer: Self-pay | Admitting: *Deleted

## 2010-11-27 ENCOUNTER — Encounter: Payer: Self-pay | Admitting: Cardiology

## 2010-11-27 DIAGNOSIS — I208 Other forms of angina pectoris: Secondary | ICD-10-CM

## 2010-11-27 DIAGNOSIS — Z01810 Encounter for preprocedural cardiovascular examination: Secondary | ICD-10-CM

## 2010-11-27 DIAGNOSIS — R079 Chest pain, unspecified: Secondary | ICD-10-CM | POA: Insufficient documentation

## 2010-11-27 DIAGNOSIS — I10 Essential (primary) hypertension: Secondary | ICD-10-CM

## 2010-11-27 LAB — CBC WITH DIFFERENTIAL/PLATELET
Basophils Absolute: 0 10*3/uL (ref 0.0–0.1)
Basophils Relative: 0.5 % (ref 0.0–3.0)
HCT: 41 % (ref 36.0–46.0)
Hemoglobin: 13.4 g/dL (ref 12.0–15.0)
Lymphs Abs: 1.2 10*3/uL (ref 0.7–4.0)
Monocytes Relative: 3.7 % (ref 3.0–12.0)
Neutro Abs: 5.8 10*3/uL (ref 1.4–7.7)
RDW: 15.7 % — ABNORMAL HIGH (ref 11.5–14.6)

## 2010-11-27 LAB — BASIC METABOLIC PANEL
CO2: 26 mEq/L (ref 19–32)
GFR: 85.93 mL/min (ref >60.00)
Glucose, Bld: 102 mg/dL — ABNORMAL HIGH (ref 70–99)
Potassium: 4.2 mEq/L (ref 3.5–5.1)
Sodium: 141 mEq/L (ref 135–145)

## 2010-11-27 MED ORDER — ATENOLOL 50 MG PO TABS: ORAL_TABLET | ORAL | Status: DC

## 2010-11-27 NOTE — Progress Notes (Signed)
NFA:Annette Duncan female I saw in March of 2012 for evaluation of palpitations. TSH, hemoglobin and potassium normal in February 2012. We increased her atenolol and scheduled and echo which she did not have performed. Since then, she is describing dyspnea on exertion relieved with rest. There is no orthopnea, PND, pedal edema and, syncope. She also describes chest tightness. It is substernal without radiation. This occurs with exertion and is relieved with rest. There is associated diaphoresis. She does not have these symptoms at rest. She occasionally feels brief palpitations but this is not particularly problematic.  Current Outpatient Prescriptions  Medication Sig Dispense Refill  . ALPHA LIPOIC ACID PO Take 250 mg by mouth daily.        Marland Kitchen atenolol (TENORMIN) 50 MG tablet Take 50 mg by mouth 2 (two) times daily.        . hydrochlorothiazide 25 MG tablet Take 1 tablet (25 mg total) by mouth daily.  90 tablet  1  . Misc Natural Products (CORDYMAX CS-4 PO) Take by mouth. Take 2 daily       . Ubiquinol 50 MG CAPS Take 50 mg by mouth 2 (two) times daily.           Past Medical History  Diagnosis Date  . Hypertension     No past surgical history on file.  History   Social History  . Marital Status: Unknown    Spouse Name: N/A    Number of Children: N/A  . Years of Education: N/A   Occupational History  . Not on file.   Social History Main Topics  . Smoking status: Former Smoker    Types: Cigarettes    Quit date: 03/24/2002  . Smokeless tobacco: Not on file  . Alcohol Use: Not on file  . Drug Use: Not on file  . Sexually Active: Not on file   Other Topics Concern  . Not on file   Social History Narrative  . No narrative on file    ROS: no fevers or chills, productive cough, hemoptysis, dysphasia, odynophagia, melena, hematochezia, dysuria, hematuria, rash, seizure activity, orthopnea, PND, pedal edema, claudication. Remaining systems are negative.  Physical  Exam: Well-developed obese in no acute distress.  Skin is warm and dry.  HEENT is normal.  Neck is supple. No thyromegaly.  Chest is clear to auscultation with normal expansion.  Cardiovascular exam is regular rate and rhythm.  Abdominal exam nontender or distended. No masses palpated. Extremities show no edema. neuro grossly intact  ECG normal sinus rhythm at a rate of 76. Nonspecific ST changes.

## 2010-11-27 NOTE — Telephone Encounter (Signed)
Fax # [phone]

## 2010-11-27 NOTE — Patient Instructions (Addendum)
Your physician has requested that you have a cardiac catheterization. Cardiac catheterization is used to diagnose and/or treat various heart conditions. Doctors may recommend this procedure for a number of different reasons. The most common reason is to evaluate chest pain. Chest pain can be a symptom of coronary artery disease (CAD), and cardiac catheterization can show whether plaque is narrowing or blocking your heart's arteries. This procedure is also used to evaluate the valves, as well as measure the blood flow and oxygen levels in different parts of your heart. For further information please visit [email or web address] Please follow instruction sheet, as given.   Your physician recommends that you return for lab work in: TODAY  Your physician recommends that you schedule a follow-up appointment in: 6 WEEKS   INCREASE ATENOLOL 50 MG TAKE ONE AND ONE HALF TABLETS TWICE DAILY  START ASPIRIN 81 MG ONCE DAILY WITH MEALS

## 2010-11-27 NOTE — Telephone Encounter (Signed)
Spoke with pt, she needs a note for work. Per dr Jens Som the pt will not be able to return to work until Monday. Note faxed to number provided Deliah Goody

## 2010-11-27 NOTE — Assessment & Plan Note (Signed)
Her blood pressure is elevated. Increase atenolol to 75 mg p.o. B.i.d.

## 2010-11-27 NOTE — Assessment & Plan Note (Addendum)
Chest pain is extremely concerning for angina. Plan add aspirin and continue beta blocker. Feel definitive evaluation is needed. We will plan to proceed with cardiac catheterization. The risks and benefits have been discussed and the patient agrees to proceed. I have asked her to limit her activities until we can perform her procedure.

## 2010-11-27 NOTE — Telephone Encounter (Signed)
Pt was seen today has some question.

## 2010-11-28 ENCOUNTER — Inpatient Hospital Stay (HOSPITAL_BASED_OUTPATIENT_CLINIC_OR_DEPARTMENT_OTHER)
Admission: RE | Admit: 2010-11-28 | Discharge: 2010-11-28 | Disposition: A | Discharge status: Home / Self Care | Payer: BC Managed Care – PPO | Source: Ambulatory Visit | Attending: Cardiology | Admitting: Cardiology

## 2010-11-28 ENCOUNTER — Telehealth: Payer: Self-pay | Admitting: Cardiology

## 2010-11-28 DIAGNOSIS — R079 Chest pain, unspecified: Secondary | ICD-10-CM | POA: Insufficient documentation

## 2010-11-28 DIAGNOSIS — R0602 Shortness of breath: Secondary | ICD-10-CM | POA: Insufficient documentation

## 2010-11-28 NOTE — Telephone Encounter (Signed)
Pt calling re procedure today, cardiac cath, was not given any meds and still having same symptoms

## 2010-11-28 NOTE — Telephone Encounter (Signed)
Spoke with pt, the cath she had today was normal and her EF% was normal as well. Pt was told to follow up with her primary care md for non-cardiac chest pain Annette Duncan

## 2010-11-29 ENCOUNTER — Telehealth: Payer: Self-pay | Admitting: Cardiology

## 2010-11-29 NOTE — Telephone Encounter (Signed)
Pt calling regarding call from yesterday from [place]. Please return pt call to discuss further.

## 2010-11-29 NOTE — Telephone Encounter (Signed)
leeft message for pt, call yesterday was to let her know the labs and cxr she had prior to her cath was normal Annette Duncan

## 2010-12-03 ENCOUNTER — Ambulatory Visit: Payer: BC Managed Care – PPO | Admitting: Family Medicine

## 2010-12-03 DIAGNOSIS — Z0289 Encounter for other administrative examinations: Secondary | ICD-10-CM

## 2010-12-04 NOTE — Cardiovascular Report (Signed)
  Annette Duncan, Annette Duncan                  ACCOUNT NO.:  [id number]  MEDICAL RECORD NO.:  [id number]  LOCATION:                                 FACILITY:  PHYSICIAN:  Arturo Morton. Riley Kill, MD, FACCDATE OF BIRTH:  22-Feb-1951  DATE OF PROCEDURE:  11/28/2010 DATE OF DISCHARGE:                           CARDIAC CATHETERIZATION   INDICATIONS:  This is a nice 60 year old referred from [place]. She has had some chest pain and shortness of breath.  She was seen by Dr. Jens Som in consultation and he felt that cardiac catheterization was indicated.  Risks, benefits and alternatives were discussed with the patient.  She consented to proceed.  PROCEDURE: 1. Left heart catheterization. 2. Selective coronary arteriography. 3. Selective left ventriculography.  DESCRIPTION OF PROCEDURE:  The procedure was performed from the right femoral artery with 4-French catheters.  She tolerated the procedure without complication and was taken to the holding area in satisfactory clinical condition for sheath removal.  She was given 2 mg of Versed and 50 mg of fentanyl for the sedation as she remained awake really for the more joy of the procedure.  HEMODYNAMIC DATA: 1. Central aortic pressure is 154/78, mean 108. 2. LV pressure 137/14. 3. No gradient or pullback across the aortic valve.  ANGIOGRAPHIC DATA: 1. Ventriculography in the RAO projection reveals mild global     hypokinesis, EF would be estimated at 50-55% range.  It does not     appear to be severe could be related to hypertension. 2. Left main is free of disease. 3. The left anterior descending artery courses to the apex.  There are     two major diagonal branches.  The second diagonal branch may have     some minimal plaquing, but no significant focal obstruction is     noted.  The LAD and its branches are free of significant disease. 4. The circumflex consists predominantly of a single marginal branch     free of critical disease. 5. The  right coronary is moderate size vessel providing a large     posterior descending and smaller posterolateral system.  The PDA is     free of critical disease.  CONCLUSIONS: 1. Mild reduction in overall left ventricular systolic function with     estimated ejection fraction of 50-55% range. 2. No definite wall motion abnormalities. 3. Large caliber coronary arteries, with no evidence of calcification     or significant focal constriction.  DISPOSITION: 1. The patient will follow up with Dr. Jens Som. 2. He will direct further evaluation.  She has some mild interstitial     change on her chest x-ray.  Echocardiography, sleep study,     hypertension, and weight management may be part of considerations     going forward.     Arturo Morton. Riley Kill, MD, [facility]     TDS/MEDQ  D:  11/28/2010  T:  11/28/2010  Job:  [id number]  cc:   Madolyn Frieze. Jens Som, MD, [facility] Nani Gasser, M.D.  Electronically Signed by Shawnie Pons MD [facility] on 12/04/2010 06:09:40 PM

## 2010-12-12 ENCOUNTER — Ambulatory Visit: Payer: BC Managed Care – PPO | Admitting: Physician Assistant

## 2010-12-12 ENCOUNTER — Encounter: Payer: BC Managed Care – PPO | Admitting: Cardiology

## 2010-12-16 ENCOUNTER — Encounter: Payer: Self-pay | Admitting: Family Medicine

## 2010-12-16 ENCOUNTER — Ambulatory Visit (INDEPENDENT_AMBULATORY_CARE_PROVIDER_SITE_OTHER): Payer: BC Managed Care – PPO | Admitting: Family Medicine

## 2010-12-16 VITALS — BP 138/87 | HR 64 | Wt 265.0 lb

## 2010-12-16 DIAGNOSIS — I10 Essential (primary) hypertension: Secondary | ICD-10-CM

## 2010-12-16 DIAGNOSIS — Z1231 Encounter for screening mammogram for malignant neoplasm of breast: Secondary | ICD-10-CM

## 2010-12-16 NOTE — Progress Notes (Signed)
  Subjective:    Patient ID: Annette Duncan, female    DOB: 1950-05-03, 60 y.o.   MRN: [id number]  Hypertension This is a chronic problem. The current episode started more than 1 month ago. The problem is controlled. Pertinent negatives include no chest pain or shortness of breath. There are no associated agents to hypertension. Risk factors for coronary artery disease include obesity and sedentary lifestyle. Past treatments include beta blockers and diuretics. The current treatment provides mild improvement. There are no compliance problems.   No more chest pain. Has really been working on decreasing the salt in her diet.  She plans on startign an exercise regime.     Review of Systems  Respiratory: Negative for shortness of breath.   Cardiovascular: Negative for chest pain.       Objective:   Physical Exam  Constitutional: She appears well-developed and well-nourished.  HENT:  Head: Normocephalic and atraumatic.  Cardiovascular: Normal rate, regular rhythm and normal heart sounds.   Pulmonary/Chest: Effort normal and breath sounds normal.  Skin: Skin is warm and dry.  Psychiatric: She has a normal mood and affect. Her behavior is normal.          Assessment & Plan:  HTN - At goal but barely. discussed working on exercise, weight loss and conitnuing to read labels for low salt diet.  She has made great progress. FU in 3 months.   Declined flu vac today \  Due for mammogram.  Given stool cards today.

## 2010-12-18 ENCOUNTER — Encounter: Payer: Self-pay | Admitting: Cardiology

## 2010-12-18 ENCOUNTER — Ambulatory Visit (INDEPENDENT_AMBULATORY_CARE_PROVIDER_SITE_OTHER): Payer: BC Managed Care – PPO | Admitting: Cardiology

## 2010-12-18 DIAGNOSIS — I10 Essential (primary) hypertension: Secondary | ICD-10-CM

## 2010-12-18 DIAGNOSIS — R002 Palpitations: Secondary | ICD-10-CM

## 2010-12-18 DIAGNOSIS — R079 Chest pain, unspecified: Secondary | ICD-10-CM

## 2010-12-18 NOTE — Assessment & Plan Note (Signed)
Improved. Continue higher dose beta blocker.

## 2010-12-18 NOTE — Assessment & Plan Note (Signed)
Blood pressure mildly elevated. She will follow this and she has implemented lifestyle modification. She will followup with her primary care physician for further management.

## 2010-12-18 NOTE — Progress Notes (Signed)
[id number] female I saw in March of 2012 for evaluation of palpitations. TSH, hemoglobin and potassium normal in February 2012. We increased her atenolol and scheduled and echo which she did not have performed. When I last saw her in Sept 2012, she was complaining of chest pain. Cardiac cath in sept of 2012 showed no obstructive disease and EF 50-55. Since I last saw her, she denies dyspnea or chest pain. Her palpitations have also improved.   Current Outpatient Prescriptions  Medication Sig Dispense Refill  . ALPHA LIPOIC ACID PO Take 250 mg by mouth daily.        Marland Kitchen aspirin EC 81 MG tablet Take 1 tablet (81 mg total) by mouth daily.  150 tablet  2  . atenolol (TENORMIN) 50 MG tablet TAKE ONE AND ONE HALF TABLETS TWICE DAILY  90 tablet  12  . hydrochlorothiazide 25 MG tablet Take 1 tablet (25 mg total) by mouth daily.  90 tablet  1  . Ubiquinol 50 MG CAPS Take 50 mg by mouth 2 (two) times daily.           Past Medical History  Diagnosis Date  . Hypertension     No past surgical history on file.  History   Social History  . Marital Status: Unknown    Spouse Name: N/A    Number of Children: N/A  . Years of Education: N/A   Occupational History  . Not on file.   Social History Main Topics  . Smoking status: Former Smoker    Types: Cigarettes    Quit date: 03/24/2002  . Smokeless tobacco: Not on file  . Alcohol Use: Not on file  . Drug Use: Not on file  . Sexually Active: Not on file   Other Topics Concern  . Not on file   Social History Narrative  . No narrative on file    ROS: no fevers or chills, productive cough, hemoptysis, dysphasia, odynophagia, melena, hematochezia, dysuria, hematuria, rash, seizure activity, orthopnea, PND, pedal edema, claudication. Remaining systems are negative.  Physical Exam: Well-developed obese in no acute distress.  Skin is warm and dry.  HEENT is normal.  Neck is supple. No thyromegaly.  Chest is clear to auscultation with normal  expansion.  Cardiovascular exam is regular rate and rhythm.  Abdominal exam nontender or distended. No masses palpated. Right groin with no hematoma no bruit. Extremities show no edema. neuro grossly intact

## 2010-12-18 NOTE — Assessment & Plan Note (Signed)
No further symptoms. Catheterization revealed no obstructive coronary disease. No further evaluation.

## 2011-01-02 ENCOUNTER — Encounter: Payer: Self-pay | Admitting: *Deleted

## 2011-01-02 ENCOUNTER — Encounter: Payer: Self-pay | Admitting: Cardiology

## 2011-01-09 ENCOUNTER — Ambulatory Visit: Payer: BC Managed Care – PPO | Admitting: Cardiology

## 2011-01-14 ENCOUNTER — Telehealth: Payer: Self-pay | Admitting: Cardiology

## 2011-01-14 ENCOUNTER — Telehealth: Payer: Self-pay | Admitting: Family Medicine

## 2011-01-14 NOTE — Telephone Encounter (Signed)
Pt is calling for results of CXR.  She states she still has the sob and chest pain.  Ntg does not help the chest pain.  It starts with exertion and decreases with rest.  She states the pain is still in the middle of her chest.  She gets the sob and pain daily.

## 2011-01-14 NOTE — Telephone Encounter (Signed)
Pt stated she had a CXR several months ago and has never gotten the report.  Please call patient back with this.  [phone].

## 2011-01-14 NOTE — Telephone Encounter (Signed)
Patient's cardiac cath in Sept of 2012 with no CAD; repeat chest xray and check ddimer; if neg, she should fu with her primary care. Annette Duncan

## 2011-01-14 NOTE — Telephone Encounter (Signed)
Pt called and is still complaining of chest pain with some SOB that is intermittent in nature but when she has the chest pain she does rate it #9/10.  She was all clear with heart workup according to the pt when she saw Dr. Jens Som recently.  SHe notates the pain/SOB with exertion.  Pt wants to know if CXR would be ordered even though pt denies all cold, chest, nasal, head congestion and or cough assoc with fever. Plan:  No appts till 01-22-11, and please advise if okay to wait til this date to put pt on the schedule.  Pt does want to be seen sooner. Routed to Dr. Marlyne Beards, LPN [facility]

## 2011-01-14 NOTE — Telephone Encounter (Signed)
Pt called back this am a second time and said she recalled already having a CXR.  Will not need another one.  Pt also commented that Dr. Jens Som had raised her atenolol med to 75 mg from 50 mg.   Plan:  Pt informed to call Dr. Ludwig Clarks office and speak with him about what to do with med regimen since pt commented she noted the symptoms she is having when the dose of med was increased.  Pt voiced understanding and will call card office and let them handle the problem. Jarvis Newcomer, LPN [facility]

## 2011-01-15 ENCOUNTER — Telehealth: Payer: Self-pay | Admitting: *Deleted

## 2011-01-15 DIAGNOSIS — R0602 Shortness of breath: Secondary | ICD-10-CM

## 2011-01-15 NOTE — Telephone Encounter (Signed)
Pt called back and would like to have labs and cxr done tomorrow. Will send orders to [facility]

## 2011-01-15 NOTE — Telephone Encounter (Signed)
Spoke with pt, she is not able to do the cxr and lab work today, she is leaving for [place]. She states the pain last night was not as bad. She is aware to go to the ER in [place] for pain. She will call when she returns to town if cont to have problems Annette Duncan

## 2011-02-24 ENCOUNTER — Telehealth: Payer: Self-pay | Admitting: *Deleted

## 2011-02-24 NOTE — Letter (Signed)
Summary: Out of Work  [facility]  [address]   [place], [place] [address]   Phone: [phone]  Fax: [phone]    November 26, 2010   Employee:  Olegario Messier Leet    To Whom It May Concern:   For Medical reasons, please excuse the above named employee from work for the following dates:  Start:   11/26/2010  Return: 11/28/2010    If you need additional information, please feel free to contact our office.         Sincerely,    Clemens Catholic LPN

## 2011-02-24 NOTE — Telephone Encounter (Signed)
  Phone Note Call from Patient Call back at Home Phone [phone]   Caller: Patient Reason for Call: Talk to Nurse Summary of Call: Patient called reporting that her symptoms from her visit on 09/10/2010 are not any better. She has not filled her Rx antibiotic yet. She c/o persistant SOB. In looking at her past visit with Dr. Linford Arnold she was on HCTZ which she said gave her relief. She was supposed to f/u with her a month after her vists, she said she went out of town and never followed up. I advised her to call Dr. Shelah Lewandowsky office to get an appointment or an rx for the HCTZ refilled. If she cannot get an appointment today then come into the urgent care. She voiced understanding.  Initial call taken by: Lajean Saver RN,  September 13, 2010 10:00 AM

## 2011-02-24 NOTE — Telephone Encounter (Signed)
Pt called and states she is having SOB, chest pain and tightness, BP is "off the charts" and sweating. Pt wanted to come in to see Dr. Linford Arnold. Advised pt she needed to go straight to the ED with those sxs. Pt voiced understanding.

## 2011-02-24 NOTE — Progress Notes (Signed)
Summary: SHORTNESS OF BREATH Room 5   Vital Signs:  Patient Profile:   60 Years Old Female CC:      Headache, tickle in throat x 2 days Height:     68 inches Weight:      271 pounds O2 Sat:      100 % O2 treatment:    Room Air Temp:     98.8 degrees F oral Pulse rate:   70 / minute Pulse rhythm:   regular Resp:     20 per minute BP sitting:   208 / 88  (left arm) Cuff size:   large  Vitals Entered By: Emilio Math (September 10, 2010 10:27 AM)                  Current Allergies: No known allergies History of Present Illness Chief Complaint: Headache, tickle in throat x 2 days History of Present Illness:  Subjective: Patient complains of mild sore throat, cough, and headache two days.  She has now developed a sensation of shortness of breath    No pleuritic pain No wheezing + nasal congestion + post-nasal drainage No sinus pain/pressure No itchy/red eyes No earache No hemoptysis No fever/chills No nausea No vomiting No abdominal pain No diarrhea No skin rashes + fatigue No myalgias      Current Meds ATENOLOL 50 MG TABS (ATENOLOL) Take one tablet by mouth twice a day BENZONATATE 200 MG CAPS (BENZONATATE) One by mouth hs as needed cough AMOXICILLIN 875 MG TABS (AMOXICILLIN) One by mouth two times a day (Rx void after 09/18/10) TRAMADOL HCL 50 MG TABS (TRAMADOL HCL) 1 tab by  mouth q4 to 6hr as needed pain  REVIEW OF SYSTEMS Constitutional Symptoms      Denies fever, chills, night sweats, weight loss, weight gain, and fatigue.  Eyes       Denies change in vision, eye pain, eye discharge, glasses, contact lenses, and eye surgery. Ear/Nose/Throat/Mouth       Denies hearing loss/aids, change in hearing, ear pain, ear discharge, dizziness, frequent runny nose, frequent nose bleeds, sinus problems, sore throat, hoarseness, and tooth pain or bleeding.  Respiratory       Complains of shortness of breath.      Denies dry cough, productive cough, wheezing, asthma,  bronchitis, and emphysema/COPD.  Cardiovascular       Denies murmurs, chest pain, and tires easily with exhertion.    Gastrointestinal       Denies stomach pain, nausea/vomiting, diarrhea, constipation, blood in bowel movements, and indigestion. Genitourniary       Denies painful urination, kidney stones, and loss of urinary control. Neurological       Complains of headaches.      Denies paralysis, seizures, and fainting/blackouts. Musculoskeletal       Denies muscle pain, joint pain, joint stiffness, decreased range of motion, redness, swelling, muscle weakness, and gout.  Skin       Denies bruising, unusual mles/lumps or sores, and hair/skin or nail changes.  Psych       Denies mood changes, temper/anger issues, anxiety/stress, speech problems, depression, and sleep problems.  Past History:  Past Medical History: Reviewed history from 06/06/2010 and no changes required. HYPERTENSION  - Dr. Jens Som  Past Surgical History: Reviewed history from 06/04/2010 and no changes required. none  Family History: Reviewed history from 06/06/2010 and no changes required. Sisiter : murmur Mother: enlarged heart, HTN  Father with prostate cancer Aunt with DM  Social History: Reviewed history from  06/06/2010 and no changes required. 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    Objective:  Appearance:  Patient is obese but otherwise appears stated age, and in no acute distress.  No dyspnea Eyes:  Pupils are equal, round, and reactive to light and accomodation.  Extraocular movement is intact.  Conjunctivae are not inflamed.  Ears:  Canals normal.  Tympanic membranes normal.   Nose:  Mildly congested turbinates.  No sinus tenderness  Pharynx:  Normal  Neck:  Supple.  Posterior nodes are prominent but non-tender Lungs:  Clear to auscultation.   Breath sounds are equal.  Chest:  Distinct tenderness over sternum Heart:  Regular rate and rhythm without murmurs, rubs, or gallops.  Abdomen:  Nontender without masses or hepatosplenomegaly.  Bowel sounds are present.  No CVA or flank tenderness.  Extremities:  No edema.  Pedal pulses are full and equal.  Assessment New Problems: COSTOCHONDRITIS, ACUTE (ICD-733.6) UPPER RESPIRATORY INFECTION, ACUTE (ICD-465.9) DYSPNEA (ICD-786.05)  NO EVIDENCE BACTERIAL INFECTION TODAY.  SENSATION OF DYSPNEA CAUSED BY COSTOCHONDRITIS.  NOTE O2 SAT 100%.  SUSPECT EARLY VIRAL URI  Plan New Medications/Changes: TRAMADOL HCL 50 MG TABS (TRAMADOL HCL) 1 tab by  mouth q4 to 6hr as needed pain  #15 (fifteen) x 0, 09/10/2010, Donna Christen MD AMOXICILLIN 875 MG TABS (AMOXICILLIN) One by mouth two times a day (Rx void after 09/18/10)  #14 x 0, 09/10/2010, Donna Christen MD BENZONATATE 200 MG CAPS (BENZONATATE) One by mouth hs as needed cough  #12 x 0, 09/10/2010, Donna Christen MD  New Orders: Est. Patient Level III [[address]] Pulse Oximetry (single measurment) [60454] Planning Comments:   Treat symptomatically for now:  Increase fluid intake, begin expectorant, topical decongestant,  cough suppressant at bedtime.  Ultram for sternum pain.  If fever/chills/sweats persist, or if not improving one week,  begin amoxicillin (given Rx to hold).  Followup with PCP if not improving 10 to 14 days.   The patient and/or caregiver has been counseled thoroughly with regard to medications prescribed including dosage, schedule, interactions, rationale for use, and possible side effects and they verbalize understanding.  Diagnoses and expected course of recovery discussed and will return if not improved as expected or if the condition worsens. Patient and/or caregiver verbalized understanding.  Prescriptions: TRAMADOL HCL 50 MG TABS (TRAMADOL HCL) 1 tab by  mouth q4 to 6hr as needed pain  #15 (fifteen) x 0   Entered and Authorized  by:   Donna Christen MD   Signed by:   Donna Christen MD on 09/10/2010   Method used:   Print then Give to Patient   RxID:   [id number] AMOXICILLIN 875 MG TABS (AMOXICILLIN) One by mouth two times a day (Rx void after 09/18/10)  #14 x 0   Entered and Authorized by:   Donna Christen MD   Signed by:   Donna Christen MD on 09/10/2010   Method used:   Print then Give to Patient   RxID:   [id number] BENZONATATE 200 MG CAPS (BENZONATATE) One by mouth hs as needed cough  #12 x 0   Entered and Authorized by:   Donna Christen MD   Signed by:   Donna Christen MD on 09/10/2010   Method used:   Print then Give to Patient   RxID:   [id number]   Patient Instructions: 1)  Take Mucinex  (guaifenesin)  twice daily for congestion. 2)  Increase fluid intake, rest. 3)  May use Afrin nasal spray (or generic oxymetazoline) twice daily for about 5 days for nasal congestion.  Also recommend using saline nasal spray several times daily and/or saline nasal irrigation. 4)  Begin Amoxicillin if not improving about one week or if persistent fever develops. 5)  Followup with family doctor if not improving 10 to 14 days.   Orders Added: 1)  Est. Patient Level III [78295] 2)  Pulse Oximetry (single measurment) [62130]

## 2011-02-24 NOTE — Progress Notes (Signed)
Summary: CHEST PAIN,SOB/WB rm 4   Vital Signs:  Patient Profile:   60 Years Old Female CC:      SOB, chest pain x 4 days Height:     68 inches Weight:      266.75 pounds O2 Sat:      90 % O2 treatment:    Room Air Temp:     98.0 degrees F oral Pulse rate:   57 / minute Resp:     20 per minute BP sitting:   162 / 79  (left arm) Cuff size:   large  Vitals Entered By: Clemens Catholic LPN (November 26, 2010 9:01 AM)                  Updated Prior Medication List: ATENOLOL 50 MG TABS (ATENOLOL) Take one tablet by mouth twice a day BENZONATATE 200 MG CAPS (BENZONATATE) One by mouth hs as needed cough  Current Allergies (reviewed today): No known allergies History of Present Illness Chief Complaint: SOB, chest pain x 4 days History of Present Illness: See previous few notes.  She has been having CP, SOB, intermittantly for about a year.  It lasts a few mins, then goes away when she sits down.  It occurs about every 2 months.  It seems to happen more when she's at work, but she doesn't know if it's related to stress.  She used to smoke, she has HTN, she is overweight.  She has tried to get in to the cardiologist, but has had some trouble with scheduling it.  REVIEW OF SYSTEMS Constitutional Symptoms      Denies fever, chills, night sweats, weight loss, weight gain, and fatigue.  Eyes       Denies change in vision, eye pain, eye discharge, glasses, contact lenses, and eye surgery. Ear/Nose/Throat/Mouth       Denies hearing loss/aids, change in hearing, ear pain, ear discharge, dizziness, frequent runny nose, frequent nose bleeds, sinus problems, sore throat, hoarseness, and tooth pain or bleeding.  Respiratory       Complains of shortness of breath.      Denies dry cough, productive cough, wheezing, asthma, bronchitis, and emphysema/COPD.  Cardiovascular       Complains of chest pain.      Denies murmurs and tires easily with exhertion.    Gastrointestinal       Denies stomach  pain, nausea/vomiting, diarrhea, constipation, blood in bowel movements, and indigestion. Genitourniary       Denies painful urination, kidney stones, and loss of urinary control. Neurological       Denies paralysis, seizures, and fainting/blackouts. Musculoskeletal       Denies muscle pain, joint pain, joint stiffness, decreased range of motion, redness, swelling, muscle weakness, and gout.  Skin       Denies bruising, unusual mles/lumps or sores, and hair/skin or nail changes.  Psych       Denies mood changes, temper/anger issues, anxiety/stress, speech problems, depression, and sleep problems. Other Comments: pt c/o SOB, chest pain, worse with exertion. no swelling.    Past History:  Past Medical History: Reviewed history from 06/06/2010 and no changes required. HYPERTENSION  - Dr. Jens Som  Past Surgical History: Reviewed history from 06/04/2010 and no changes required. none  Family History: Reviewed history from 06/06/2010 and no changes required. Sisiter : murmur Mother: enlarged heart, HTN  Father with prostate cancer Aunt with DM  Social History: Reviewed history from 06/06/2010 and no changes required. 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  Physical Exam General appearance: well developed, obese, no acute distress Chest/Lungs: no rales, wheezes, or rhonchi bilateral, breath sounds equal without effort Heart: regular rate and  rhythm, no murmur MSE: oriented to time, place, and person Assessment New Problems: CHEST PAIN, ACUTE (ICD-786.50)   Plan New Orders: Est. Patient Level III [14782] Pulse Oximetry (single measurment) [94760] Planning Comments:   Could be cardiac (angina) vs stress. Since is long-standing and seems stable, would like to schedule her for cardiology appt.  Toniann Fail will schedule her within 1-2 days.   ER precautions given.  Likely will need stress test.  Also, weight reduction, control of BP.    The patient and/or caregiver has been counseled thoroughly with regard to medications prescribed including dosage, schedule, interactions, rationale for use, and possible side effects and they verbalize understanding.  Diagnoses and expected course of recovery discussed and will return if not improved as expected or if the condition worsens. Patient and/or caregiver verbalized understanding.   Orders Added: 1)  Est. Patient Level III [95621] 2)  Pulse Oximetry (single measurment) [30865]

## 2011-02-24 NOTE — Letter (Signed)
Summary: Out of Work  [facility]  [address]   [place], [place] [address]   Phone: [phone]  Fax: [phone]    September 10, 2010   Employee:  Olegario Messier Zylka    To Whom It May Concern:   For Medical reasons, please excuse the above named employee from work today through 09/15/10.   If you need additional information, please feel free to contact our office.         Sincerely,    Donna Christen MD

## 2011-03-20 ENCOUNTER — Encounter: Payer: Self-pay | Admitting: Cardiology

## 2011-03-21 ENCOUNTER — Encounter: Payer: Self-pay | Admitting: Cardiology

## 2011-03-24 ENCOUNTER — Encounter: Payer: Self-pay | Admitting: Cardiology

## 2011-03-26 ENCOUNTER — Telehealth: Payer: Self-pay | Admitting: Cardiology

## 2011-03-26 NOTE — Telephone Encounter (Signed)
Spoke with pt, she was admitted to [facility] for 5 days and was told she has pulmonary htn and needs right heart cath. Made appt to see dr Jens Som in [place] next week, will get records from [facility].

## 2011-03-26 NOTE — Telephone Encounter (Signed)
New Msg: pt calling regarding needing to schedule right side cardiac cath. Please return pt call to discuss further.

## 2011-03-26 NOTE — Telephone Encounter (Addendum)
ROI faxed to [facility] to Obtain Records for PT who will be  Seeing Crenshaw in [place] next Week 03/26/11/Km  Records received from [facility] gave to Victorio Palm  03/26/11/KM

## 2011-04-02 ENCOUNTER — Ambulatory Visit (INDEPENDENT_AMBULATORY_CARE_PROVIDER_SITE_OTHER): Payer: BC Managed Care – PPO | Admitting: Cardiology

## 2011-04-02 ENCOUNTER — Encounter: Payer: Self-pay | Admitting: Cardiology

## 2011-04-02 ENCOUNTER — Encounter: Payer: Self-pay | Admitting: *Deleted

## 2011-04-02 ENCOUNTER — Other Ambulatory Visit: Payer: Self-pay | Admitting: Cardiology

## 2011-04-02 DIAGNOSIS — R002 Palpitations: Secondary | ICD-10-CM

## 2011-04-02 DIAGNOSIS — I272 Pulmonary hypertension, unspecified: Secondary | ICD-10-CM | POA: Insufficient documentation

## 2011-04-02 DIAGNOSIS — I10 Essential (primary) hypertension: Secondary | ICD-10-CM

## 2011-04-02 DIAGNOSIS — Z01810 Encounter for preprocedural cardiovascular examination: Secondary | ICD-10-CM

## 2011-04-02 DIAGNOSIS — I2789 Other specified pulmonary heart diseases: Secondary | ICD-10-CM

## 2011-04-02 MED ORDER — AMLODIPINE BESYLATE 5 MG PO TABS: 5.0000 mg | ORAL_TABLET | Freq: Every day | ORAL | Status: DC

## 2011-04-02 NOTE — Assessment & Plan Note (Signed)
Patient has severe hypertension based on outside records. I spent over 30 minutes reviewing her records from [facility]. She had an extensive evaluation. Pulmonary embolus has been excluded with CTA. Rheumatologic workup has been negative. Sarcoid has been excluded. She had bronchoscopy with biopsies as well that were negative. There is a question of whether or uncontrolled hypertension and venous pulmonary hypertension is contributing. However note previous left ventricular end-diastolic pressure on catheterization in September of 2012 was 14. Most likely she has pulmonary hypertension from sleep apnea and obesity. I have discussed the patient with Dr. Gala Romney. Plan right heart catheterization. I will arrange for her to be seen by pulmonary to evaluate for sleep apnea. She is on home oxygen at present. Further recommendations once above studies complete.

## 2011-04-02 NOTE — Assessment & Plan Note (Signed)
Blood pressure is elevated. Add Norvasc 5 mg daily. 

## 2011-04-02 NOTE — Progress Notes (Signed)
HPI: Pleasant female I saw in March of 2012 for evaluation of palpitations. TSH, hemoglobin and potassium normal in February 2012. We increased her atenolol and scheduled and echo which she did not have performed. When I saw her in Sept 2012, she was complaining of chest pain. Cardiac cath in sept of 2012 showed no obstructive disease and EF 50-55. LVEDP 14. Patient admitted in December of 2012 to [facility] with shortness of breath. Arterial blood gas showed a pH of 7.42, PCO2 37, and PO2 54. 88% on room air. Echocardiogram by report shows normal LV function, impaired left ventricular diastolic relaxation, severe pulmonary hypertension with RVSP > 60 mmHg, mild tricuspid regurgitation and mild mitral regurgitation. CTA was negative for pulmonary embolus; it showed mild bronchiectasis and mild lymphadenopathy. PET scan showed nonspecific mildly hypermetabolic adenopathy. Focal airspace opacity or nodule in the right upper lobe not hypermetabolic. Patient had bronchoscopy with biopsies which revealed no malignancy and reactive lymphocytes.  Note ACE level was within normal limits at 44. TSH 3.84. LFTs mildly elevated. Hepatitis panel was negative. ESR 6. Rheumatoid factor was within normal limits. ANA screen was negative. CANCA and PANCA negative. Patient was diagnosed with pulmonary hypertension most likely either from severely uncontrolled hypertension versus undiagnosed sleep apnea. She was DCed on home O2. She now describes severe dyspnea on exertion with minimal activities. She also has palpitations and dizziness with exertion. There is also chest tightness with exertion. She has no symptoms at rest. No syncope or pedal edema. Patient does snore.   Current Outpatient Prescriptions  Medication Sig Dispense Refill  . ALPHA LIPOIC ACID PO Take 250 mg by mouth daily.        Marland Kitchen aspirin EC 81 MG tablet Take 1 tablet (81 mg total) by mouth daily.  150 tablet  2  . atenolol (TENORMIN) 50 MG tablet  TAKE ONE AND ONE HALF TABLETS TWICE DAILY  90 tablet  12  . hydrochlorothiazide 25 MG tablet Take 1 tablet (25 mg total) by mouth daily.  90 tablet  1  . Ubiquinol 50 MG CAPS Take 50 mg by mouth 2 (two) times daily.           Past Medical History  Diagnosis Date  . Hypertension   . Palpitations   . Pulmonary hypertension     No past surgical history on file.  History   Social History  . Marital Status: Unknown    Spouse Name: N/A    Number of Children: N/A  . Years of Education: N/A   Occupational History  . Not on file.   Social History Main Topics  . Smoking status: Former Smoker    Types: Cigarettes    Quit date: 03/24/2002  . Smokeless tobacco: Not on file  . Alcohol Use: Not on file  . Drug Use: Not on file  . Sexually Active: Not on file   Other Topics Concern  . Not on file   Social History Narrative  . No narrative on file    ROS: no fevers or chills, productive cough, hemoptysis, dysphasia, odynophagia, melena, hematochezia, dysuria, hematuria, rash, seizure activity, orthopnea, PND, pedal edema, claudication. Remaining systems are negative.  Physical Exam: Well-developed obese in no acute distress.  Skin is warm and dry.  HEENT is normal.  Neck is supple. No thyromegaly.  Chest is clear to auscultation with normal expansion.  Cardiovascular exam is regular rate and rhythm.  Abdominal exam nontender or distended. No masses palpated. Extremities show no  edema. neuro grossly intact  ECG NSR with nonspecific ST changes

## 2011-04-02 NOTE — Assessment & Plan Note (Signed)
Continue beta blocker. 

## 2011-04-02 NOTE — Patient Instructions (Signed)
Your physician recommends that you schedule a follow-up appointment in: 2 WEEKS  Your physician has requested that you have a cardiac catheterization. Cardiac catheterization is used to diagnose and/or treat various heart conditions. Doctors may recommend this procedure for a number of different reasons. The most common reason is to evaluate chest pain. Chest pain can be a symptom of coronary artery disease (CAD), and cardiac catheterization can show whether plaque is narrowing or blocking your heart's arteries. This procedure is also used to evaluate the valves, as well as measure the blood flow and oxygen levels in different parts of your heart. For further information please visit [email or web address] Please follow instruction sheet, as given. WITH DR Gala Romney   REFERRAL TO PULMONARY IN [place]   Your physician recommends that you return for lab work in: TODAY  START AMLODIPINE 5 MG ONCE DAILY

## 2011-04-03 ENCOUNTER — Encounter: Payer: Self-pay | Admitting: Family Medicine

## 2011-04-03 ENCOUNTER — Ambulatory Visit (INDEPENDENT_AMBULATORY_CARE_PROVIDER_SITE_OTHER): Payer: BC Managed Care – PPO | Admitting: Family Medicine

## 2011-04-03 ENCOUNTER — Other Ambulatory Visit: Payer: Self-pay | Admitting: Cardiology

## 2011-04-03 VITALS — BP 136/84 | HR 81 | Wt 266.0 lb

## 2011-04-03 DIAGNOSIS — I10 Essential (primary) hypertension: Secondary | ICD-10-CM

## 2011-04-03 DIAGNOSIS — Z1211 Encounter for screening for malignant neoplasm of colon: Secondary | ICD-10-CM

## 2011-04-03 DIAGNOSIS — I2789 Other specified pulmonary heart diseases: Secondary | ICD-10-CM

## 2011-04-03 DIAGNOSIS — I272 Pulmonary hypertension, unspecified: Secondary | ICD-10-CM

## 2011-04-03 DIAGNOSIS — Z1231 Encounter for screening mammogram for malignant neoplasm of breast: Secondary | ICD-10-CM

## 2011-04-03 LAB — CBC WITH DIFFERENTIAL/PLATELET
Basophils Absolute: 0 10*3/uL (ref 0.0–0.1)
Basophils Relative: 0 % (ref 0–1)
Eosinophils Absolute: 0.2 10*3/uL (ref 0.0–0.7)
Eosinophils Relative: 2 % (ref 0–5)
MCH: 28.7 pg (ref 26.0–34.0)
MCV: 88.7 fL (ref 78.0–100.0)
Neutrophils Relative %: 77 % (ref 43–77)
Platelets: 272 10*3/uL (ref 150–400)
RDW: 14.6 % (ref 11.5–15.5)

## 2011-04-03 LAB — BASIC METABOLIC PANEL WITH GFR
CO2: 25 mEq/L (ref 19–32)
Calcium: 9.3 mg/dL (ref 8.4–10.5)
Creat: 1.13 mg/dL — ABNORMAL HIGH (ref 0.50–1.10)
GFR, Est African American: 62 mL/min
GFR, Est Non African American: 54 mL/min — ABNORMAL LOW
Sodium: 139 mEq/L (ref 135–145)

## 2011-04-03 LAB — PROTIME-INR: Prothrombin Time: 14.1 seconds (ref 11.6–15.2)

## 2011-04-03 MED ORDER — SODIUM CHLORIDE 0.9 % IJ SOLN: 3.0000 mL | INTRAMUSCULAR | Status: DC | PRN

## 2011-04-03 MED ORDER — SODIUM CHLORIDE 0.9 % IJ SOLN: 3.0000 mL | Freq: Two times a day (BID) | INTRAMUSCULAR | Status: DC

## 2011-04-03 MED ORDER — SODIUM CHLORIDE 0.9 % IV SOLN: 250.0000 mL | INTRAVENOUS | Status: DC | PRN

## 2011-04-03 NOTE — Patient Instructions (Signed)

## 2011-04-03 NOTE — Progress Notes (Addendum)
  Subjective:    Patient ID: Annette Duncan, female    DOB: 07/10/1950, 61 y.o.   MRN: [id number]  HPI Admitted 03/20/11 and d/c 03/25/11 at [facility]. .  Feels well except then gets SOB with exercise. She is now on oxygen.  She was dx with pulmonary hypertension. Please see Dr. Waunita Schooner note from [facility].  She has a cath set up to looks at the righ tside of her heart. She did start the amlodipine. Says tolerating well and no SE.    Review of Systems     Objective:   Physical Exam  Constitutional: She is oriented to person, place, and time. She appears well-developed and well-nourished.  HENT:  Head: Normocephalic and atraumatic.  Cardiovascular: Normal rate, regular rhythm and normal heart sounds.   Pulmonary/Chest: Effort normal and breath sounds normal.  Neurological: She is alert and oriented to person, place, and time.  Skin: Skin is warm and dry.  Psychiatric: She has a normal mood and affect. Her behavior is normal.          Assessment & Plan:  HTN- Much better today. We reviewed the low salt diet again. She feels ok on the amlodipine so far. Keep followup with cardiology. I will also complete her FMLA paperwork if she wants to drop it off next week. For Now I gave her a new work note.  Pulmonary HTN- She says today is the first day her breathing is much better. She is wearing her oxygen today. She may be feeling much better in part because her blood pressures actually controlled today. Keep followup with cardiology.  We also discussed getting her scheduled for mammogram et Karie Soda. She's also got behind on getting a colonoscopy and we discussed trying to schedule this for the spring.

## 2011-04-04 ENCOUNTER — Telehealth: Payer: Self-pay | Admitting: Cardiology

## 2011-04-04 NOTE — Telephone Encounter (Signed)
04/04/11--0800--pt calling  she has no transport for c. Cath on Monday--1/14---i called donna at [facility] and she agreed to let heather schub and cath lab know--nt

## 2011-04-04 NOTE — Telephone Encounter (Signed)
New problem:  Pt need to R/S her cath that schedule on Monday.

## 2011-04-07 ENCOUNTER — Inpatient Hospital Stay (HOSPITAL_BASED_OUTPATIENT_CLINIC_OR_DEPARTMENT_OTHER)
Admission: RE | Admit: 2011-04-07 | Payer: BC Managed Care – PPO | Source: Ambulatory Visit | Admitting: Internal Medicine

## 2011-04-07 ENCOUNTER — Encounter (HOSPITAL_BASED_OUTPATIENT_CLINIC_OR_DEPARTMENT_OTHER): Admission: RE | Payer: Self-pay | Source: Ambulatory Visit

## 2011-04-07 SURGERY — JV RIGHT HEART CATHETERIZATION
Anesthesia: Moderate Sedation

## 2011-04-08 NOTE — Telephone Encounter (Signed)
Spoke with pt, she would need a Friday appt to reschedule her cath. Will look over the schedule and try to accommodate.

## 2011-04-09 NOTE — Telephone Encounter (Signed)
Deb - hey. i am happy to do this almost any Friday that works for her no matter what else I am doing that day. Thanks -dan

## 2011-04-11 NOTE — Telephone Encounter (Signed)
Spoke with pt, cath scheduled for Friday 05-02-11 @ 7:30am in the [facility] with dr bensimhon.

## 2011-04-21 ENCOUNTER — Telehealth: Payer: Self-pay | Admitting: *Deleted

## 2011-04-21 NOTE — Telephone Encounter (Signed)
Pt states that she was d/c from hospital Jan1st for hypertension, dizziness and shob. She would like a work note to be out. States she doesn't have another f/u with you scheduled. pt would like note faxed to [phone] attn: Toniann Fail.

## 2011-04-21 NOTE — Telephone Encounter (Signed)
We don't do work notes for hospital stay. Can show her d/c papers for work.

## 2011-04-22 ENCOUNTER — Ambulatory Visit: Payer: BC Managed Care – PPO

## 2011-04-22 ENCOUNTER — Encounter: Payer: Self-pay | Admitting: Pulmonary Disease

## 2011-04-22 ENCOUNTER — Ambulatory Visit (INDEPENDENT_AMBULATORY_CARE_PROVIDER_SITE_OTHER): Payer: BC Managed Care – PPO | Admitting: Pulmonary Disease

## 2011-04-22 VITALS — BP 130/90 | HR 74 | Temp 98.3°F | Ht 68.0 in | Wt 257.0 lb

## 2011-04-22 DIAGNOSIS — I2789 Other specified pulmonary heart diseases: Secondary | ICD-10-CM

## 2011-04-22 DIAGNOSIS — G4733 Obstructive sleep apnea (adult) (pediatric): Secondary | ICD-10-CM | POA: Insufficient documentation

## 2011-04-22 DIAGNOSIS — I272 Pulmonary hypertension, unspecified: Secondary | ICD-10-CM

## 2011-04-22 NOTE — Assessment & Plan Note (Signed)
With scattered interstitial infiltrate &mediastinal lymphadenopathy & hypoxia, sarcoidosis remains primary in the differential diagnosis. EBUS was performed but I did not see transbronchial biopsies. I will obtain the records from [place] to review including CT, biopsy results. Also PFTs will be reviewed for lung volumes & DLCO to see if this is c/w interstitial cause of hypoxia Right heart cath is planned.

## 2011-04-22 NOTE — Patient Instructions (Signed)
You have pulmonary hypertension - high pressure in the lungs Heart cath will decide if this is due to poor relaxation of the heart muscle Proceed with sleep study - this may add to t he pressure in the lungs Use oxygen during sleep & walking

## 2011-04-22 NOTE — Progress Notes (Signed)
  Subjective:    Patient ID: Annette Duncan, female    DOB: 04/13/1950, 61 y.o.   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 She was admitted in December of 2012 to [facility] with grade 3-4 dyspnea that was ongoing for several months. Arterial blood gas showed a pH of 7.42, PCO2 37, and PO2 54. 88% on room air. Echocardiogram  showed normal LV function, impaired left ventricular diastolic relaxation, severe pulmonary hypertension with RVSP > 60 mmHg, mild tricuspid regurgitation and mild mitral regurgitation. CTA was negative for pulmonary embolus; it showed scattered interstitial infx, mild bronchiectasis and mediastinal  lymphadenopathy. PET scan showed nonspecific mildly hypermetabolic adenopathy. Focal airspace opacity or nodule in the right upper lobe not hypermetabolic. EBUS of 4R LN revealed no malignancy and reactive lymphocytes. ACE level was within normal limits at 44. TSH 3.84. LFTs mildly elevated. Hepatitis panel was negative. ESR 6. Rheumatoid factor was within normal limits. ANA screen was negative. c-ANCA and p-ANCA negative. She was DCed on home O2.  Patient was diagnosed with pulmonary hypertension most likely either from severely uncontrolled hypertension versus undiagnosed sleep apnea. Cardiac cath in sept of 2012 showed no obstructive disease and EF 50-55. LVEDP 14. Rt heart cath is planned  She now describes severe dyspnea on exertion with minimal activities. She also has palpitations and dizziness with exertion. There is also chest tightness with exertion. She has no symptoms at rest. No syncope or pedal edema.  She has worked the 3rd shift x 17 yrs, bedtime around noon & oob around 8p on workdays . On weekends & holidays , she would revert to nightly sleep schedule. She sleeps on her side with 2 pillows. Loud snoring has been noted by family members but no bed partner history available for witnessed apneas.  She has cut down on caffeine & lost 8 lbs since discharge. ESS 4/24 There is no history suggestive of cataplexy, sleep paralysis or parasomnias O2 satn 86% on RA , acceptable on 2-3 L O2 while ambulating.    Review of Systems  Constitutional: Negative for fever and unexpected weight change.  HENT: Positive for sneezing and dental problem. Negative for ear pain, nosebleeds, congestion, sore throat, rhinorrhea, trouble swallowing, postnasal drip and sinus pressure.   Eyes: Negative for redness and itching.  Respiratory: Positive for chest tightness, shortness of breath and wheezing. Negative for cough.   Cardiovascular: Positive for palpitations. Negative for leg swelling.  Gastrointestinal: Negative for nausea, vomiting and diarrhea.  Genitourinary: Negative for dysuria.  Musculoskeletal: Negative for joint swelling.  Skin: Negative for rash.  Neurological: Positive for headaches.  Hematological: Does not bruise/bleed easily.  Psychiatric/Behavioral: Negative for dysphoric mood. The patient is not nervous/anxious.        Objective:   Physical Exam  Gen. Pleasant, obese, in no distress, normal affect ENT - no lesions, no post nasal drip, class 2-3 airway Neck: No JVD, no thyromegaly, no carotid bruits Lungs: no use of accessory muscles, no dullness to percussion, decreased without rales or rhonchi  Cardiovascular: Rhythm regular, heart sounds  normal, no murmurs or gallops, no peripheral edema Abdomen: soft and non-tender, no hepatosplenomegaly, BS normal. Musculoskeletal: No deformities, no cyanosis or clubbing Neuro:  alert, non focal, no tremors       Assessment & Plan:

## 2011-04-22 NOTE — Telephone Encounter (Signed)
Pt informed

## 2011-04-22 NOTE — Assessment & Plan Note (Signed)
Given excessive daytime somnolence, narrow pharyngeal exam, witnessed apneas & loud snoring, obstructive sleep apnea is very likely & an overnight polysomnogram will be scheduled as a split study. The pathophysiology of obstructive sleep apnea , it's cardiovascular consequences & modes of treatment including CPAP were discused with the patient in detail & they evidenced understanding.  

## 2011-04-23 ENCOUNTER — Encounter: Payer: Self-pay | Admitting: Cardiology

## 2011-04-23 ENCOUNTER — Ambulatory Visit (INDEPENDENT_AMBULATORY_CARE_PROVIDER_SITE_OTHER): Payer: BC Managed Care – PPO | Admitting: Cardiology

## 2011-04-23 DIAGNOSIS — I272 Pulmonary hypertension, unspecified: Secondary | ICD-10-CM

## 2011-04-23 DIAGNOSIS — I10 Essential (primary) hypertension: Secondary | ICD-10-CM

## 2011-04-23 DIAGNOSIS — R002 Palpitations: Secondary | ICD-10-CM

## 2011-04-23 DIAGNOSIS — I2789 Other specified pulmonary heart diseases: Secondary | ICD-10-CM

## 2011-04-23 MED ORDER — AMLODIPINE BESYLATE 10 MG PO TABS: 10.0000 mg | ORAL_TABLET | Freq: Every day | ORAL | Status: DC

## 2011-04-23 NOTE — Assessment & Plan Note (Signed)
Blood pressure continues to be elevated. Increase amlodipine to 10 mg daily.

## 2011-04-23 NOTE — Assessment & Plan Note (Signed)
Continue beta blocker. 

## 2011-04-23 NOTE — Progress Notes (Signed)
HPI: Pleasant female I saw in March of 2012 for evaluation of palpitations. TSH, hemoglobin and potassium normal in February 2012. We increased her atenolol and scheduled and echo which she did not have performed. When I saw her in Sept 2012, she was complaining of chest pain. Cardiac cath in sept of 2012 showed no obstructive disease and EF 50-55. LVEDP 14. Patient admitted in December of 2012 to [facility] with shortness of breath. Arterial blood gas showed a pH of 7.42, PCO2 37, and PO2 54. 88% on room air. Echocardiogram by report shows normal LV function, impaired left ventricular diastolic relaxation, severe pulmonary hypertension with RVSP > 60 mmHg, mild tricuspid regurgitation and mild mitral regurgitation. CTA was negative for pulmonary embolus; it showed mild bronchiectasis and mild lymphadenopathy. PET scan showed nonspecific mildly hypermetabolic adenopathy. Focal airspace opacity or nodule in the right upper lobe not hypermetabolic. Patient had bronchoscopy with biopsies which revealed no malignancy and reactive lymphocytes. Note ACE level was within normal limits at 44. TSH 3.84. LFTs mildly elevated. Hepatitis panel was negative. ESR 6. Rheumatoid factor was within normal limits. ANA screen was negative. CANCA and PANCA negative. Patient was diagnosed with pulmonary hypertension most likely either from severely uncontrolled hypertension versus undiagnosed sleep apnea. She was DCed on home O2. I saw her in Jan 2013 and arranged outpatient cath; this was rescheduled because of transportation issues. Also arranged pulmonary eval for sleep apnea/ohs. Pulmonary also reviewing for possible sarcoid. Since I last saw her, she continues to have dyspnea on exertion but improved with oxygen. No orthopnea, PND, pedal edema or syncope. She occasionally has chest tightness with exertion but much improved with oxygen.  Current Outpatient Prescriptions  Medication Sig Dispense Refill  . ALPHA  LIPOIC ACID PO Take 250 mg by mouth daily.        Marland Kitchen amLODipine (NORVASC) 5 MG tablet Take 1 tablet (5 mg total) by mouth daily.  30 tablet  11  . aspirin EC 81 MG tablet Take 1 tablet (81 mg total) by mouth daily.  150 tablet  2  . atenolol (TENORMIN) 50 MG tablet TAKE ONE AND ONE HALF TABLETS TWICE DAILY  90 tablet  12  . hydrochlorothiazide 25 MG tablet Take 1 tablet (25 mg total) by mouth daily.  90 tablet  1  . Ubiquinol 50 MG CAPS Take 100 mg by mouth 2 (two) times daily.        Current Facility-Administered Medications  Medication Dose Route Frequency Provider Last Rate Last Dose  . 0.9 %  sodium chloride infusion  250 mL Intravenous PRN Lewayne Bunting, MD         Past Medical History  Diagnosis Date  . Hypertension   . Palpitations   . Pulmonary hypertension     Past Surgical History  Procedure Date  . None     History   Social History  . Marital Status: Unknown    Spouse Name: N/A    Number of Children: N/A  . 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 Smoking status: Former Smoker -- 1.0 packs/day for 20 years    Types: Cigarettes    Quit date: 03/24/2002  . Smokeless tobacco: Never Used  . Alcohol Use: No  . Drug Use: No  . Sexually Active: Not on file   Other Topics Concern  . Not on file   Social History Narrative  . No  narrative on file    ROS: no fevers or chills, productive cough, hemoptysis, dysphasia, odynophagia, melena, hematochezia, dysuria, hematuria, rash, seizure activity, orthopnea, PND, pedal edema, claudication. Remaining systems are negative.  Physical Exam: Well-developed obese in no acute distress.  Skin is warm and dry.  HEENT is normal.  Neck is supple. No thyromegaly.  Chest is clear to auscultation with normal expansion.  Cardiovascular exam is regular rate and rhythm.  Abdominal exam nontender or distended. No masses palpated. Extremities show no edema. neuro grossly  intact

## 2011-04-23 NOTE — Assessment & Plan Note (Addendum)
Patient has severe hypertension based on outside records. Records from [facility] reviewed previously. She had an extensive evaluation. Pulmonary embolus has been excluded with CTA. Rheumatologic workup has been negative. She had bronchoscopy with biopsies as well that were negative. I think pulmonary hypertension is most likely secondary to obesity hypoventilation syndrome and obstructive sleep apnea. There is a question of whether uncontrolled hypertension and venous pulmonary hypertension is contributing. However note previous left ventricular end-diastolic pressure on catheterization in September of 2012 was 14. She has seen pulmonary. Sleep study is scheduled and she will most likely need CPAP. We discussed the importance of weight loss. She is also being evaluated for sarcoid but this seems less likely (note previous ACE level normal). She has rescheduled her right heart catheterization for February 8. Further medications based on results. Continue home O2.

## 2011-04-23 NOTE — Patient Instructions (Signed)
Your physician recommends that you schedule a follow-up appointment in: 3 MONTHS  INCREASE AMLODIPINE TO 10 MG ONCE DAILY(TAKE 2 5 MG TABLETS AT THE SMETIME ONCE DAILY)

## 2011-04-28 ENCOUNTER — Telehealth: Payer: Self-pay | Admitting: Cardiology

## 2011-04-28 ENCOUNTER — Encounter: Payer: Self-pay | Admitting: *Deleted

## 2011-04-28 NOTE — Telephone Encounter (Signed)
Spoke with pt, aware letter faxed to the number provided.

## 2011-04-28 NOTE — Telephone Encounter (Signed)
New msg Pt called. She saw Dr Jens Som in [place]. [phone] fax number to wendy. Please fax to her job so she can be out of work

## 2011-05-01 ENCOUNTER — Other Ambulatory Visit: Payer: Self-pay | Admitting: Cardiology

## 2011-05-01 ENCOUNTER — Telehealth: Payer: Self-pay | Admitting: *Deleted

## 2011-05-01 LAB — PROTIME-INR: Prothrombin Time: 12.6 seconds (ref 11.6–15.2)

## 2011-05-01 LAB — CBC
HCT: 40.2 % (ref 36.0–46.0)
Hemoglobin: 13.3 g/dL (ref 12.0–15.0)
MCH: 28.7 pg (ref 26.0–34.0)
MCHC: 33.1 g/dL (ref 30.0–36.0)
RDW: 14.3 % (ref 11.5–15.5)

## 2011-05-01 LAB — BASIC METABOLIC PANEL
BUN: 23 mg/dL (ref 6–23)
CO2: 29 mEq/L (ref 19–32)
Calcium: 9.6 mg/dL (ref 8.4–10.5)
Chloride: 103 mEq/L (ref 96–112)
Creat: 1.1 mg/dL (ref 0.50–1.10)

## 2011-05-01 NOTE — Telephone Encounter (Signed)
F/U  Patient calling for result of labwork taken today for Cardiac cath procedure in the morning.  Please call patient ASAP [phone] to allow her to prepare for surgery or cancel.

## 2011-05-01 NOTE — Telephone Encounter (Signed)
Pt calls for results of pre cath labs drawn earlier today in [place].  At this time we do not have the results. She would like a call back this afternoon with the results. She is to be at the [facility] 05/02/11 at 6:30am Mylo Red RN

## 2011-05-01 NOTE — Telephone Encounter (Signed)
Pt aware Dr. Jens Som has reviewed labs. Cath is still on for tomorrow. Mylo Red RN

## 2011-05-01 NOTE — Telephone Encounter (Signed)
05/01/11--1030am--called pt per request debra miller poole--ms Pulsifer having c cath 05/02/11 and it was discovered that her previous lab work was too old to use for c cath--I called ms Musial and asked her to go to our [place] office and have labs redrawn--pt agrees--paperwork faxed to [employer] poole to what for results--nt

## 2011-05-02 ENCOUNTER — Inpatient Hospital Stay (HOSPITAL_BASED_OUTPATIENT_CLINIC_OR_DEPARTMENT_OTHER)
Admission: RE | Admit: 2011-05-02 | Discharge: 2011-05-02 | Disposition: A | Discharge status: Home / Self Care | Payer: BC Managed Care – PPO | Source: Ambulatory Visit | Attending: Internal Medicine | Admitting: Internal Medicine

## 2011-05-02 ENCOUNTER — Encounter (HOSPITAL_BASED_OUTPATIENT_CLINIC_OR_DEPARTMENT_OTHER)
Admission: RE | Disposition: A | Discharge status: Home / Self Care | Payer: Self-pay | Source: Ambulatory Visit | Attending: Internal Medicine

## 2011-05-02 ENCOUNTER — Encounter (HOSPITAL_BASED_OUTPATIENT_CLINIC_OR_DEPARTMENT_OTHER): Payer: Self-pay | Admitting: Internal Medicine

## 2011-05-02 DIAGNOSIS — E662 Morbid (severe) obesity with alveolar hypoventilation: Secondary | ICD-10-CM | POA: Insufficient documentation

## 2011-05-02 DIAGNOSIS — I272 Pulmonary hypertension, unspecified: Secondary | ICD-10-CM

## 2011-05-02 DIAGNOSIS — I2789 Other specified pulmonary heart diseases: Secondary | ICD-10-CM | POA: Insufficient documentation

## 2011-05-02 DIAGNOSIS — I079 Rheumatic tricuspid valve disease, unspecified: Secondary | ICD-10-CM | POA: Insufficient documentation

## 2011-05-02 DIAGNOSIS — I279 Pulmonary heart disease, unspecified: Secondary | ICD-10-CM

## 2011-05-02 DIAGNOSIS — G4733 Obstructive sleep apnea (adult) (pediatric): Secondary | ICD-10-CM | POA: Insufficient documentation

## 2011-05-02 LAB — POCT I-STAT 3, VENOUS BLOOD GAS (G3P V)
Acid-Base Excess: 2 mmol/L (ref 0.0–2.0)
Acid-Base Excess: 3 mmol/L — ABNORMAL HIGH (ref 0.0–2.0)
Acid-Base Excess: 3 mmol/L — ABNORMAL HIGH (ref 0.0–2.0)
Bicarbonate: 28.1 mEq/L — ABNORMAL HIGH (ref 20.0–24.0)
Bicarbonate: 29.3 mEq/L — ABNORMAL HIGH (ref 20.0–24.0)
O2 Saturation: 68 %
O2 Saturation: 70 %
O2 Saturation: 70 %
TCO2: 30 mmol/L (ref 0–100)
TCO2: 31 mmol/L (ref 0–100)
pCO2, Ven: 48.2 mmHg (ref 45.0–50.0)
pCO2, Ven: 46.9 mmHg (ref 45.0–50.0)
pCO2, Ven: 50.4 mmHg — ABNORMAL HIGH (ref 45.0–50.0)
pH, Ven: 7.376 — ABNORMAL HIGH (ref 7.250–7.300)
pH, Ven: 7.372 — ABNORMAL HIGH (ref 7.250–7.300)
pH, Ven: 7.374 — ABNORMAL HIGH (ref 7.250–7.300)
pO2, Ven: 38 mmHg (ref 30.0–45.0)
pO2, Ven: 35 mmHg (ref 30.0–45.0)
pO2, Ven: 38 mmHg (ref 30.0–45.0)

## 2011-05-02 SURGERY — JV RIGHT HEART CATHETERIZATION
Anesthesia: Moderate Sedation

## 2011-05-02 MED ORDER — SODIUM CHLORIDE 0.9 % IV SOLN: 1.0000 mL/kg/h | INTRAVENOUS | Status: DC

## 2011-05-02 MED ORDER — DIAZEPAM 5 MG PO TABS
5.0000 mg | ORAL_TABLET | ORAL | Status: AC
  Administered 2011-05-02: 5 mg via ORAL

## 2011-05-02 MED ORDER — ONDANSETRON HCL 4 MG/2ML IJ SOLN: 4.0000 mg | Freq: Four times a day (QID) | INTRAMUSCULAR | Status: DC | PRN

## 2011-05-02 MED ORDER — ACETAMINOPHEN 325 MG PO TABS: 650.0000 mg | ORAL_TABLET | ORAL | Status: DC | PRN

## 2011-05-02 MED ORDER — SODIUM CHLORIDE 0.9 % IV SOLN
INTRAVENOUS | Status: DC
  Administered 2011-05-02: 07:00:00 via INTRAVENOUS

## 2011-05-02 MED ORDER — ASPIRIN 81 MG PO CHEW
324.0000 mg | CHEWABLE_TABLET | ORAL | Status: AC
  Administered 2011-05-02: 324 mg via ORAL

## 2011-05-02 NOTE — H&P (View-Only) (Signed)
 HPI: Pleasant female I saw in March of 2012 for evaluation of palpitations. TSH, hemoglobin and potassium normal in February 2012. We increased her atenolol and scheduled and echo which she did not have performed. When I saw her in Sept 2012, she was complaining of chest pain. Cardiac cath in sept of 2012 showed no obstructive disease and EF 50-55. LVEDP 14. Patient admitted in December of 2012 to [facility] with shortness of breath. Arterial blood gas showed a pH of 7.42, PCO2 37, and PO2 54. 88% on room air. Echocardiogram by report shows normal LV function, impaired left ventricular diastolic relaxation, severe pulmonary hypertension with RVSP > 60 mmHg, mild tricuspid regurgitation and mild mitral regurgitation. CTA was negative for pulmonary embolus; it showed mild bronchiectasis and mild lymphadenopathy. PET scan showed nonspecific mildly hypermetabolic adenopathy. Focal airspace opacity or nodule in the right upper lobe not hypermetabolic. Patient had bronchoscopy with biopsies which revealed no malignancy and reactive lymphocytes. Note ACE level was within normal limits at 44. TSH 3.84. LFTs mildly elevated. Hepatitis panel was negative. ESR 6. Rheumatoid factor was within normal limits. ANA screen was negative. CANCA and PANCA negative. Patient was diagnosed with pulmonary hypertension most likely either from severely uncontrolled hypertension versus undiagnosed sleep apnea. She was DCed on home O2. I saw her in Jan 2013 and arranged outpatient cath; this was rescheduled because of transportation issues. Also arranged pulmonary eval for sleep apnea/ohs. Pulmonary also reviewing for possible sarcoid. Since I last saw her, she continues to have dyspnea on exertion but improved with oxygen. No orthopnea, PND, pedal edema or syncope. She occasionally has chest tightness with exertion but much improved with oxygen.  Current Outpatient Prescriptions  Medication Sig Dispense Refill  . ALPHA  LIPOIC ACID PO Take 250 mg by mouth daily.        . amLODipine (NORVASC) 5 MG tablet Take 1 tablet (5 mg total) by mouth daily.  30 tablet  11  . aspirin EC 81 MG tablet Take 1 tablet (81 mg total) by mouth daily.  150 tablet  2  . atenolol (TENORMIN) 50 MG tablet TAKE ONE AND ONE HALF TABLETS TWICE DAILY  90 tablet  12  . hydrochlorothiazide 25 MG tablet Take 1 tablet (25 mg total) by mouth daily.  90 tablet  1  . Ubiquinol 50 MG CAPS Take 100 mg by mouth 2 (two) times daily.        Current Facility-Administered Medications  Medication Dose Route Frequency Provider Last Rate Last Dose  . 0.9 %  sodium chloride infusion  250 mL Intravenous PRN Brian S Crenshaw, MD         Past Medical History  Diagnosis Date  . Hypertension   . Palpitations   . Pulmonary hypertension     Past Surgical History  Procedure Date  . None     History   Social History  . Marital Status: Unknown    Spouse Name: N/A    Number of Children: N/A  . 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 Smoking status: Former Smoker -- 1.0 packs/day for 20 years    Types: Cigarettes    Quit date: 03/24/2002  . Smokeless tobacco: Never Used  . Alcohol Use: No  . Drug Use: No  . Sexually Active: Not on file   Other Topics Concern  . Not on file   Social History Narrative  . No   narrative on file    ROS: no fevers or chills, productive cough, hemoptysis, dysphasia, odynophagia, melena, hematochezia, dysuria, hematuria, rash, seizure activity, orthopnea, PND, pedal edema, claudication. Remaining systems are negative.  Physical Exam: Well-developed obese in no acute distress.  Skin is warm and dry.  HEENT is normal.  Neck is supple. No thyromegaly.  Chest is clear to auscultation with normal expansion.  Cardiovascular exam is regular rate and rhythm.  Abdominal exam nontender or distended. No masses palpated. Extremities show no edema. neuro grossly  intact      

## 2011-05-02 NOTE — Interval H&P Note (Signed)
History and Physical Interval Note:  05/02/2011 7:58 AM  Annette Duncan  has presented today for surgery, with the diagnosis of pulmonary hypertension  The various methods of treatment have been discussed with the patient and family. After consideration of risks, benefits and other options for treatment, the patient has consented to  Procedure(s): [facility] RIGHT HEART CATHETERIZATION as a surgical intervention .  The patients' history has been reviewed, patient examined, no change in status, stable for surgery.  I have reviewed the patients' chart and labs.  Questions were answered to the patient's satisfaction.     Yatzil Clippinger

## 2011-05-02 NOTE — Progress Notes (Signed)
Discharge instructions completed, ambulated to bathroom without bleeding from right groin site, discharged to home via wheelchair.

## 2011-05-02 NOTE — Progress Notes (Signed)
Bedrest begins @ 0900.

## 2011-05-02 NOTE — Op Note (Signed)
Cardiac Cath Procedure Note:  Indication:  Pulmonary HTN  Procedures performed:  1) Right heart catheterization  Description of procedure:   The risks and indication of the procedure were explained. Consent was signed and placed on the chart. An appropriate timeout was taken prior to the procedure. The right groin was prepped and draped in the routine sterile fashion and anesthetized with 1% local lidocaine.   A 7 FR venous sheath was placed in the right femoral vein using a modified Seldinger technique. A standard Swan-Ganz catheter was used for the procedure.   Complications: None apparent.  Findings:  RA =  13 RV =   89/8/13 PA = 92/26 (50) PCW = 16 Fick cardiac output/index = 5.8/2.6 PVR = 5.9 Woods FA sat =  96% PA sat = 68%, 70%   RV sat = 65%  RA sat = 70% High SVC sat = 65%  Assessment:  1. Moderate to severe PAH 2. No intracardiac shunt  Plan/Discussion:  Suspect PAH multifactorial due in part to OSA and obesity hypoventilation syndrome but given degree of PAH and elevated PVR would begin trial of pulmonary vasodilator. Start AdCirca 20. F/u with sleep study.  Daniel Bensimhon 8:20 AM

## 2011-05-05 ENCOUNTER — Telehealth: Payer: Self-pay | Admitting: Cardiology

## 2011-05-05 NOTE — Telephone Encounter (Signed)
New Problem   Patient would like a return call at [phone] regarding result of 2/8 procedure with Dr. Gala Romney at [facility]

## 2011-05-05 NOTE — Telephone Encounter (Signed)
Pt calls today for results of cath done on 05/02/11. Discussed elevated pressures, which pt remembers Dr. Gala Romney discussing. She says he had also mentioned a medication and did not receive a prescription.  Would like to know if she was to start this--?AdCirca I told I would leave this message with Stanton Kidney to follow-up with her. Pt agrees with plan Will also forward to Dr. Gala Romney. Mylo Red RN

## 2011-05-06 ENCOUNTER — Inpatient Hospital Stay
Admit: 2011-05-06 | Discharge: 2011-05-06 | Disposition: A | Discharge status: Home / Self Care | Payer: BC Managed Care – PPO | Attending: Family Medicine | Admitting: Family Medicine

## 2011-05-06 ENCOUNTER — Telehealth: Payer: Self-pay | Admitting: Cardiology

## 2011-05-06 DIAGNOSIS — Z1231 Encounter for screening mammogram for malignant neoplasm of breast: Secondary | ICD-10-CM

## 2011-05-06 NOTE — Telephone Encounter (Signed)
Spoke with pt, she has just noticed the difference with the pressures in her arms. Explained it could be just the size of the vessels in her arms. She is aware of the cath and the diagnosis of pulmonary  Hypertension. Follow up will be with dr bensimhon

## 2011-05-06 NOTE — Telephone Encounter (Signed)
Can we get her on adcirca?

## 2011-05-06 NOTE — Telephone Encounter (Signed)
Pt has been doing b/p in both arms and in the left arm it runs about 126-70 and in the right arm it runs 140/80 is that normal or should she do one arm and not worry about the other arm

## 2011-05-06 NOTE — Telephone Encounter (Signed)
Dr bensimhon to follow with pt.

## 2011-05-14 ENCOUNTER — Ambulatory Visit (HOSPITAL_BASED_OUTPATIENT_CLINIC_OR_DEPARTMENT_OTHER): Payer: BC Managed Care – PPO | Attending: Pulmonary Disease | Admitting: Radiology

## 2011-05-14 VITALS — Ht 68.0 in | Wt 250.0 lb

## 2011-05-14 DIAGNOSIS — I519 Heart disease, unspecified: Secondary | ICD-10-CM | POA: Insufficient documentation

## 2011-05-14 DIAGNOSIS — Z9981 Dependence on supplemental oxygen: Secondary | ICD-10-CM | POA: Insufficient documentation

## 2011-05-14 DIAGNOSIS — E669 Obesity, unspecified: Secondary | ICD-10-CM | POA: Insufficient documentation

## 2011-05-14 DIAGNOSIS — I2789 Other specified pulmonary heart diseases: Secondary | ICD-10-CM | POA: Insufficient documentation

## 2011-05-14 DIAGNOSIS — Z79899 Other long term (current) drug therapy: Secondary | ICD-10-CM | POA: Insufficient documentation

## 2011-05-14 DIAGNOSIS — G4733 Obstructive sleep apnea (adult) (pediatric): Secondary | ICD-10-CM | POA: Insufficient documentation

## 2011-05-14 DIAGNOSIS — J9600 Acute respiratory failure, unspecified whether with hypoxia or hypercapnia: Secondary | ICD-10-CM | POA: Insufficient documentation

## 2011-05-14 DIAGNOSIS — I10 Essential (primary) hypertension: Secondary | ICD-10-CM | POA: Insufficient documentation

## 2011-05-14 DIAGNOSIS — R0902 Hypoxemia: Secondary | ICD-10-CM | POA: Insufficient documentation

## 2011-05-15 ENCOUNTER — Other Ambulatory Visit: Payer: Self-pay | Admitting: Family Medicine

## 2011-05-15 DIAGNOSIS — N63 Unspecified lump in unspecified breast: Secondary | ICD-10-CM

## 2011-05-15 DIAGNOSIS — R928 Other abnormal and inconclusive findings on diagnostic imaging of breast: Secondary | ICD-10-CM

## 2011-05-19 ENCOUNTER — Telehealth: Payer: Self-pay | Admitting: Pulmonary Disease

## 2011-05-19 DIAGNOSIS — Z9981 Dependence on supplemental oxygen: Secondary | ICD-10-CM

## 2011-05-19 DIAGNOSIS — E669 Obesity, unspecified: Secondary | ICD-10-CM

## 2011-05-19 DIAGNOSIS — R0902 Hypoxemia: Secondary | ICD-10-CM

## 2011-05-19 DIAGNOSIS — G4733 Obstructive sleep apnea (adult) (pediatric): Secondary | ICD-10-CM

## 2011-05-19 DIAGNOSIS — I519 Heart disease, unspecified: Secondary | ICD-10-CM

## 2011-05-19 DIAGNOSIS — I10 Essential (primary) hypertension: Secondary | ICD-10-CM

## 2011-05-19 DIAGNOSIS — J9600 Acute respiratory failure, unspecified whether with hypoxia or hypercapnia: Secondary | ICD-10-CM

## 2011-05-19 DIAGNOSIS — I2789 Other specified pulmonary heart diseases: Secondary | ICD-10-CM

## 2011-05-19 NOTE — Telephone Encounter (Signed)
PSG showed hypoxemia without resp events - Increase O2 to 4L/m during sleep Poor sleep efficiency - TST only 130 mins RDI 30/h  , predom hypopneas & RERAs- - recommend auto CPAP 5-12 - she is agreeable to proceed

## 2011-05-20 NOTE — Procedures (Signed)
Annette Duncan, Annette Duncan                  ACCOUNT NO.:  [id number]  MEDICAL RECORD NO.:  [id number]          PATIENT TYPE:  OUT  LOCATION:  SLEEP CENTER                 FACILITY:  [facility]  PHYSICIAN:  Oretha Milch, MD      DATE OF BIRTH:  1951-01-23  DATE OF STUDY:  05/14/2011                           NOCTURNAL POLYSOMNOGRAM  REFERRING PHYSICIAN:  Oretha Milch, MD  INDICATION FOR THE STUDY:  Evaluation of pulmonary hypertension in this 61 year old woman with hypertension, obesity, diastolic dysfunction, and hypoxemic respiratory failure requiring home oxygen.  At the time of this study, she weighed 250 pounds with a height of 5 feet 8 inches, BMI of 38, neck size of 16 inches.  EPWORTH SLEEPINESS SCORE:  5.  MEDICATIONS:  Atenolol, amlodipine, hydrochlorothiazide.  This nocturnal polysomnogram was performed with a sleep technologist in attendance.  EEG, EOG, EMG, EKG, and respiratory parameters were recorded.  Sleep stages, arousal, limb movements, and respiratory data were scored according to criteria laid out by the American Academy of Sleep Medicine.  SLEEP ARCHITECTURE:  Lights out was at 10:31 p.m.  Lights on was at 5:05 a.m.  Total sleep time was 131 minutes with a sleep period time of 239 minutes and a sleep efficiency of 33%. Sleep latency was 154 minutes and REM sleep was not noted.  Sleep stages of the percentage of total sleep time was N1 24%, N2 76%.  Slow-wave sleep or REM sleep was not noted. Supine sleep accounted for 28 minutes.  AROUSAL DATA:  There were total of 79 arousals with an arousal index of 36 events per hour.  Of these 49 were spontaneous and the rest were associated with respiratory events.  RESPIRATORY DATA:  There were total of 2 obstructive apneas, 0 central apneas, 0 mixed apneas, and 44 hypopneas with an apnea/hypopnea index of 21 events per hour.  These were not related to sleep stage or body position.  23 RERAs were noted with an RDI of 31 events  per hour. Longest hypopnea was 57 seconds.  LIMB MOVEMENT DATA:  No significant limb movements were noted.  OXYGEN SATURATION DATA:  The desaturation index was 44 events/hour.  The lowest desaturation was 69% during non-REM sleep.  She spent 82 minutes with a saturation less than 88%.  Note that this study was performed on 2 L of oxygen and it was increased to 3 L of oxygen due to complaint of dyspnea by the patient.  CARDIAC DATA:  The low heart rate was 40 beats per minute.  The high heart rate recorded was an artifact.  Few PVCs were noted.  DISCUSSION:  Note the prolonged sleep latency in the absence of slow- wave sleep and REM sleep.  She was desensitized with a medium fullface mask but did not have sufficient amount of sleep to meet split criteria. Oxygen was initiated at 2 L and increased to 3 L at the patient's request.  IMPRESSION: 1. Moderate obstructive sleep apnea with predominant hypopnea causing     sleep fragmentation and oxygen desaturation. 2. Note that oxygen desaturation was also noted in the absence of     respiratory events which may  be related to underlying     cardiopulmonary disease (pulmonary hypertension). 3. No evidence of cardiac arrhythmias, limb movements, or behavioral     disturbance during sleep.  RECOMMENDATION: 1. The treatment options with this degree of sleep disordered     breathing include weight loss and CPAP therapy and oxygen. 2. Oxygen can be increased to 3 to 4 L during sleep. 3. Note that this degree of sleep apnea is not causative for severe     pulmonary hypertension, although it can cause mild pulmonary     hypertension. 4. CPAP can be initiated as an auto CPAP or with a formal titration study     done in the lab.  She should be cautioned against medication and     sedative side effects.  She should be advised against driving when     sleepy.     Oretha Milch, MD    RVA/MEDQ  D:  05/19/2011 12:42:12  T:  05/20/2011  00:09:48  Job:  [id number]

## 2011-05-27 ENCOUNTER — Inpatient Hospital Stay
Admit: 2011-05-27 | Discharge: 2011-05-27 | Disposition: A | Discharge status: Home / Self Care | Payer: BC Managed Care – PPO | Attending: Family Medicine | Admitting: Family Medicine

## 2011-05-27 ENCOUNTER — Ambulatory Visit: Payer: BC Managed Care – PPO | Admitting: Pulmonary Disease

## 2011-05-27 DIAGNOSIS — N63 Unspecified lump in unspecified breast: Secondary | ICD-10-CM

## 2011-05-27 DIAGNOSIS — R928 Other abnormal and inconclusive findings on diagnostic imaging of breast: Secondary | ICD-10-CM

## 2011-06-09 ENCOUNTER — Telehealth: Payer: Self-pay | Admitting: Cardiology

## 2011-06-09 NOTE — Telephone Encounter (Signed)
Pt was to get a Rx for Viagra and it still has not been called in

## 2011-06-10 ENCOUNTER — Telehealth (HOSPITAL_COMMUNITY): Payer: Self-pay | Admitting: *Deleted

## 2011-06-10 NOTE — Telephone Encounter (Signed)
Fu call Patient calling back again 

## 2011-06-10 NOTE — Telephone Encounter (Signed)
Spoke with pt, aware the heart failure clinic is working on ordering the meds she needs.

## 2011-06-10 NOTE — Telephone Encounter (Signed)
Annette Duncan called today regarding some medications that Dr Gala Romney was suppose to be ordering for her.  She is not sure what it is.  Please follow up.

## 2011-06-12 NOTE — Telephone Encounter (Signed)
Paperwork has been sent in, pt is aware

## 2011-06-17 ENCOUNTER — Ambulatory Visit (HOSPITAL_BASED_OUTPATIENT_CLINIC_OR_DEPARTMENT_OTHER)
Admission: RE | Admit: 2011-06-17 | Discharge: 2011-06-17 | Disposition: A | Discharge status: Home / Self Care | Payer: BC Managed Care – PPO | Source: Ambulatory Visit | Attending: Pulmonary Disease | Admitting: Pulmonary Disease

## 2011-06-17 ENCOUNTER — Encounter: Payer: Self-pay | Admitting: Pulmonary Disease

## 2011-06-17 ENCOUNTER — Ambulatory Visit (INDEPENDENT_AMBULATORY_CARE_PROVIDER_SITE_OTHER): Payer: BC Managed Care – PPO | Admitting: Pulmonary Disease

## 2011-06-17 VITALS — BP 152/84 | HR 87 | Temp 98.2°F | Ht 68.0 in | Wt 257.0 lb

## 2011-06-17 DIAGNOSIS — I272 Pulmonary hypertension, unspecified: Secondary | ICD-10-CM

## 2011-06-17 DIAGNOSIS — R0989 Other specified symptoms and signs involving the circulatory and respiratory systems: Secondary | ICD-10-CM

## 2011-06-17 DIAGNOSIS — I2789 Other specified pulmonary heart diseases: Secondary | ICD-10-CM

## 2011-06-17 DIAGNOSIS — G4733 Obstructive sleep apnea (adult) (pediatric): Secondary | ICD-10-CM

## 2011-06-17 DIAGNOSIS — R0609 Other forms of dyspnea: Secondary | ICD-10-CM | POA: Insufficient documentation

## 2011-06-17 DIAGNOSIS — Z0389 Encounter for observation for other suspected diseases and conditions ruled out: Secondary | ICD-10-CM

## 2011-06-17 NOTE — Assessment & Plan Note (Signed)
Obtain CT films & PFTs form [facility] + medisatinal Lns may  may represent sarcoid CXR today

## 2011-06-17 NOTE — Patient Instructions (Addendum)
Annette Duncan - copy of her pFTs Ambulatory satn on O2 - to decide how much oxygen you need CXR today Breathing test next appt in [place] We will obtain download & advise you accordingly

## 2011-06-17 NOTE — Progress Notes (Signed)
  Subjective:    Patient ID: Annette Duncan, female    DOB: 1950/07/14, 61 y.o.   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  She was admitted in December of 2012 to [facility] with grade 3-4 dyspnea that was ongoing for several months. Arterial blood gas showed a pH of 7.42, PCO2 37, and PO2 54. 88% on room air. Echocardiogram showed normal LV function, impaired left ventricular diastolic relaxation, severe pulmonary hypertension with RVSP > 60 mmHg, mild tricuspid regurgitation and mild mitral regurgitation. CTA was negative for pulmonary embolus; it showed scattered interstitial infx, mild bronchiectasis and mediastinal lymphadenopathy. PET scan showed nonspecific mildly hypermetabolic adenopathy. Focal airspace opacity or nodule in the right upper lobe not hypermetabolic. EBUS of 4R LN revealed no malignancy and reactive lymphocytes. ACE level was within normal limits at 44. TSH 3.84. LFTs mildly elevated. Hepatitis panel was negative. ESR 6. Rheumatoid factor was within normal limits. ANA screen was negative. c-ANCA and p-ANCA negative. She was DCed on home O2.  Cardiac cath in sept of 2012 showed no obstructive disease and EF 50-55. LVEDP 14.  Right heart cath cath 2/13 >> RA = 13  RV = 89/8/13  PA = 92/26 (50)  PCW = 16  Fick cardiac output/index = 5.8/2.6  PVR = 5.9 Woods  FA sat = 96%  PA sat = 68%, 70%  RV sat = 65%  RA sat = 70%  High SVC sat = 65%    06/17/2011 PSG 2/13 showed hypoxemia without resp events - Increase O2 to 4L/m during sleep  Poor sleep efficiency - TST only 130 mins  RDI 30/h , predom hypopneas & RERAs- - started on auto CPAP 5-12 -  Patient states a little better since last visit. States wearing cpap about 5 hours every night. Patient states mask and pressure are working good for her She has dyspnea on exertion with minimal activities. She has no symptoms at rest. No syncope or pedal edema.   >> started on adcirca - she has not received this yet Desatn on 4L pulse, needs 4L continuous on walking to keep satn up  She has worked the 3rd shift x 17 yrs, bedtime around noon & oob around 8p on workdays . Marland Kitchen  Review of Systems Patient denies significant cough, hemoptysis,  chest pain, palpitations, pedal edema, orthopnea, paroxysmal nocturnal dyspnea, lightheadedness, nausea, vomiting, abdominal or  leg pains      Objective:   Physical Exam  Gen. Pleasant, obese, in no distress, normal affect ENT - no lesions, no post nasal drip, class 2-3 airway Neck: No JVD, no thyromegaly, no carotid bruits Lungs: no use of accessory muscles, no dullness to percussion, decreased without rales or rhonchi  Cardiovascular: Rhythm regular, heart sounds  normal, no murmurs or gallops, no peripheral edema Abdomen: soft and non-tender, no hepatosplenomegaly, BS normal. Musculoskeletal: No deformities, no cyanosis or clubbing Neuro:  alert, non focal, no tremors       Assessment & Plan:

## 2011-06-17 NOTE — Assessment & Plan Note (Signed)
Mod -PAH Agree with adcirca Obtain next visit - may need to step up therapy

## 2011-06-17 NOTE — Assessment & Plan Note (Signed)
Obtain download & change to fixed pr - apparently good compliance Weight loss encouraged, compliance with goal of at least 4-6 hrs every night is the expectation. Advised against medications with sedative side effects Cautioned against driving when sleepy - understanding that sleepiness will vary on a day to day basis

## 2011-06-19 ENCOUNTER — Telehealth: Payer: Self-pay | Admitting: Pulmonary Disease

## 2011-06-19 NOTE — Telephone Encounter (Signed)
I spoke with pt and advised her that we sent an order for portable 4L cont oxygen. She voiced her understanding and had no questions.

## 2011-06-23 ENCOUNTER — Telehealth: Payer: Self-pay | Admitting: Pulmonary Disease

## 2011-06-23 DIAGNOSIS — R0989 Other specified symptoms and signs involving the circulatory and respiratory systems: Secondary | ICD-10-CM

## 2011-06-23 NOTE — Telephone Encounter (Signed)
I spoke with pt and is aware of RA recs, I have sent order. Nothing further was needed

## 2011-06-23 NOTE — Telephone Encounter (Signed)
I spoke with pt and she states she is wanting to cut back on her oxygen to 3L. She states the oxygen is causing her to have nose bleeds and drying her nose out bad since she has been started on 4L. Please advise Dr. Vassie Loll, thanks   Pt aware RA is not currently in the office [facility]

## 2011-06-23 NOTE — Telephone Encounter (Signed)
OK to do so. Pl send order to DME for humidifier OK to use nasal saline drops to keep mucosa moist

## 2011-06-25 ENCOUNTER — Telehealth: Payer: Self-pay | Admitting: Pulmonary Disease

## 2011-06-25 NOTE — Telephone Encounter (Signed)
Reviewed CT films from [facility] - mild interstitial scarring & bronchiectasis & lymphadenopathy noted -not typical of any disease process -? sarcoid

## 2011-07-01 ENCOUNTER — Telehealth (HOSPITAL_COMMUNITY): Payer: Self-pay | Admitting: *Deleted

## 2011-07-01 NOTE — Telephone Encounter (Signed)
error 

## 2011-07-02 ENCOUNTER — Encounter: Payer: Self-pay | Admitting: Pulmonary Disease

## 2011-07-07 ENCOUNTER — Telehealth: Payer: Self-pay | Admitting: Cardiology

## 2011-07-07 NOTE — Telephone Encounter (Signed)
Spoke with pt, she does not need to see dr Jens Som, she was given the number to the heart failure clinic. Dr bensimhon is going to manage her Pulmonary hypertension.

## 2011-07-07 NOTE — Telephone Encounter (Signed)
Pt calling re questions re a situation

## 2011-07-07 NOTE — Telephone Encounter (Signed)
Spoke with pt, she has lost her insurance and wants to make sure she is okay to be seen. Pt made aware will be no problem to see dr Jens Som.

## 2011-07-09 ENCOUNTER — Ambulatory Visit: Payer: BC Managed Care – PPO | Admitting: Cardiology

## 2011-07-21 ENCOUNTER — Telehealth (HOSPITAL_COMMUNITY): Payer: Self-pay | Admitting: *Deleted

## 2011-07-21 NOTE — Telephone Encounter (Signed)
Annette Duncan called today in regards to her prescriptions.  She is still waiting on some paperwork to be processed for her prescriptions. Can you please call her back. Thanks.

## 2011-07-22 NOTE — Telephone Encounter (Signed)
Attempted to get Adcirca approved by [facility] but was denied and told Revatio had to be tried first, per Dr Gala Romney ok for Revatio 20 mg TID.  However, in the mean time pt has lost her insurance all together and has received an application from the [employer] to receive the med for free, she has completed her portion and has mailed it to [place] to be completed and faxed, will have Dr Gala Romney sign today and fax in to them at [phone]

## 2011-07-22 NOTE — Telephone Encounter (Signed)
Left message to call back  

## 2011-07-29 ENCOUNTER — Telehealth (HOSPITAL_COMMUNITY): Payer: Self-pay | Admitting: *Deleted

## 2011-07-29 NOTE — Telephone Encounter (Signed)
Called pt to let her know that her Benay Pillow was approved for 1 year

## 2011-07-31 ENCOUNTER — Telehealth: Payer: Self-pay | Admitting: Pulmonary Disease

## 2011-07-31 DIAGNOSIS — G4733 Obstructive sleep apnea (adult) (pediatric): Secondary | ICD-10-CM

## 2011-07-31 NOTE — Telephone Encounter (Signed)
Auto 5-12 download 4/7 -07/28/11 showed avg pr 12 cm, good compliance, no residual events Change to fixed pr 12 cm

## 2011-08-01 NOTE — Telephone Encounter (Signed)
I spoke with patient about results and she verbalized understanding and had no questions 

## 2011-08-01 NOTE — Telephone Encounter (Signed)
lmomtcb x1 for pt 

## 2011-08-07 ENCOUNTER — Telehealth (HOSPITAL_COMMUNITY): Payer: Self-pay | Admitting: *Deleted

## 2011-08-07 MED ORDER — TADALAFIL (PAH) 20 MG PO TABS: 20.0000 mg | ORAL_TABLET | Freq: Every day | ORAL | Status: DC

## 2011-08-07 NOTE — Telephone Encounter (Signed)
Per Dr Gala Romney decrease Adcirca to 20 mg daily, pt is aware and agreeable she has not yet scheduled f/u appt with [place] and states she will callback when she discusses with her transportation

## 2011-08-07 NOTE — Telephone Encounter (Signed)
Spoke w/pt she states she started Adcirca 40 mg daily on Monday and she has had a headache pretty much ever since then she has taken tylenol, will discuss w/Dr Bensimhon and call her back

## 2011-08-07 NOTE — Telephone Encounter (Signed)
Ms Ron called today. She believes she is having side effects from her new medication. She would like a call back. Thanks.

## 2011-08-08 ENCOUNTER — Ambulatory Visit: Payer: BC Managed Care – PPO

## 2011-08-19 ENCOUNTER — Ambulatory Visit: Payer: BC Managed Care – PPO | Admitting: Pulmonary Disease

## 2011-08-29 ENCOUNTER — Telehealth: Payer: Self-pay | Admitting: Pulmonary Disease

## 2011-08-29 NOTE — Telephone Encounter (Signed)
Auto cpap download 5/5-08/25/11 shows good control of events on 12 cm Hope she is feeling better

## 2011-08-29 NOTE — Telephone Encounter (Signed)
I spoke with patient about results and she verbalized understanding and had no questions. She states she is feeling much better thanks

## 2011-09-01 ENCOUNTER — Telehealth: Payer: Self-pay | Admitting: *Deleted

## 2011-09-01 ENCOUNTER — Telehealth: Payer: Self-pay | Admitting: Cardiology

## 2011-09-01 MED ORDER — HYDROCHLOROTHIAZIDE 25 MG PO TABS: 25.0000 mg | ORAL_TABLET | Freq: Every day | ORAL | Status: DC

## 2011-09-01 NOTE — Telephone Encounter (Addendum)
Pt called and left a message that she needs a refill of HCTZ. She sees Dr. Jens Som for hypertension. Pt states she is retaining fluid. Advised pt that she should make her cardiologist aware since he manages her hypertension.

## 2011-09-01 NOTE — Telephone Encounter (Signed)
New msg Pt called to say she has been retaining fluid. She hasnt taken hydrochlorothiazide for about 3 weeks. Please call

## 2011-09-01 NOTE — Telephone Encounter (Signed)
Spoke with pt, she ran out of HCTZ and has been waiting on a refill. She strates she is only started retaining fluid since off the HCTZ. Refill called to pharm.

## 2011-09-05 ENCOUNTER — Encounter: Payer: Self-pay | Admitting: Pulmonary Disease

## 2011-10-06 ENCOUNTER — Telehealth (HOSPITAL_COMMUNITY): Payer: Self-pay | Admitting: Vascular Surgery

## 2011-10-06 NOTE — Telephone Encounter (Signed)
Spoke w/pt she believes she is having a reaction to [employer], she has a rash on her chest and she gets hot flashes across her face and neck at times, will stop Adcirca and see how she does, scheduled f/u appt for 8/9

## 2011-10-06 NOTE — Telephone Encounter (Signed)
Please call pt she she is having a reaction to meds.

## 2011-10-21 ENCOUNTER — Telehealth (HOSPITAL_COMMUNITY): Payer: Self-pay | Admitting: Internal Medicine

## 2011-10-21 NOTE — Telephone Encounter (Signed)
Pt has been holding her Adcirca for 2 weeks ar instructed.  She reports feeling more weakness and fatigue since being off of it and reports that she fainted on Sunday.  States she was walking down the hall and then found herself on the floor.  Reports she was out for just a few minutes.  She is calling today to know what she should do.

## 2011-10-21 NOTE — Telephone Encounter (Signed)
Pt states she is having med reactin, please advise

## 2011-10-21 NOTE — Telephone Encounter (Signed)
Per Dr Gala Romney - pt needs an appointment to be seen.  She will be scheduled.   Left message for pt to call back.

## 2011-10-22 NOTE — Telephone Encounter (Signed)
Pt has been scheduled to be seen  °

## 2011-10-31 ENCOUNTER — Encounter (HOSPITAL_COMMUNITY): Payer: Self-pay

## 2011-10-31 ENCOUNTER — Ambulatory Visit (HOSPITAL_COMMUNITY)
Admit: 2011-10-31 | Discharge: 2011-10-31 | Disposition: A | Discharge status: Home / Self Care | Payer: Self-pay | Attending: Internal Medicine | Admitting: Internal Medicine

## 2011-10-31 VITALS — BP 150/78 | HR 60 | Ht 68.0 in | Wt 258.4 lb

## 2011-10-31 DIAGNOSIS — I2789 Other specified pulmonary heart diseases: Secondary | ICD-10-CM | POA: Insufficient documentation

## 2011-10-31 DIAGNOSIS — I272 Pulmonary hypertension, unspecified: Secondary | ICD-10-CM

## 2011-10-31 DIAGNOSIS — R0609 Other forms of dyspnea: Secondary | ICD-10-CM | POA: Insufficient documentation

## 2011-10-31 DIAGNOSIS — R0989 Other specified symptoms and signs involving the circulatory and respiratory systems: Secondary | ICD-10-CM | POA: Insufficient documentation

## 2011-10-31 LAB — COMPREHENSIVE METABOLIC PANEL
ALT: 21 U/L (ref 0–35)
Albumin: 3.9 g/dL (ref 3.5–5.2)
BUN: 19 mg/dL (ref 6–23)
Calcium: 10.2 mg/dL (ref 8.4–10.5)
GFR calc Af Amer: 67 mL/min — ABNORMAL LOW (ref >90)
Glucose, Bld: 81 mg/dL (ref 70–99)
Sodium: 141 mEq/L (ref 135–145)
Total Protein: 8.1 g/dL (ref 6.0–8.3)

## 2011-10-31 NOTE — Patient Instructions (Addendum)
Your physician has requested that you have an echocardiogram. Echocardiography is a painless test that uses sound waves to create images of your heart. It provides your doctor with information about the size and shape of your heart and how well your heart's chambers and valves are working. This procedure takes approximately one hour. There are no restrictions for this procedure.  VQ Scan and chest x-ray  You have been referred to Pulmonary Rehab  Start Bosentan (Tracleer) 62.5 mg Twice daily--you have been enrolled in the patient assistance program  Your physician recommends that you schedule a follow-up appointment in: 1 month  Increase Oxygen to 6 L with moving  Your physician recommends that you schedule a follow-up appointment in: 1 month with Dr Gala Romney

## 2011-10-31 NOTE — Assessment & Plan Note (Signed)
She has severe PAH of unclear etiology. Now with Class III/IV symptoms. Has failed Adcirca due to side effects. Will try bosentan and see if she responds. Given syncope would have low threshold to repeat RHC and consider IV prostanoids. Will repeat echo and check VQ scan. Stressed need to keep sats >= 90% at all times. Use 3L at rest and 6L with exertion. Refer to pulmonary rehab. Follow closely.

## 2011-10-31 NOTE — Addendum Note (Signed)
Encounter addended by: Noralee Space, RN on: 10/31/2011 10:27 AM<BR>     Documentation filed: Patient Instructions Section

## 2011-10-31 NOTE — Addendum Note (Signed)
Encounter addended by: Noralee Space, RN on: 10/31/2011 10:02 AM<BR>     Documentation filed: Patient Instructions Section, Orders

## 2011-10-31 NOTE — Progress Notes (Signed)
  Subjective:    Patient ID: Annette Duncan, female    DOB: 09/09/1950, 61 y.o.   MRN: [id number]  HPI  Annette Duncan is a 61 y/o woman with a h/o obesity and obstructive sleep apnea & pulmonary hypertension.   She was admitted in December of 2012 to [facility] with grade 3-4 dyspnea that was ongoing for several months. Arterial blood gas showed a pH of 7.42, PCO2 37, and PO2 54. 88% on room air. Echocardiogram showed normal LV function, impaired left ventricular diastolic relaxation, severe pulmonary hypertension with RVSP > 60 mmHg, mild tricuspid regurgitation and mild mitral regurgitation. CTA was negative for pulmonary embolus; it showed scattered interstitial infx, mild bronchiectasis and mediastinal lymphadenopathy. PET scan showed nonspecific mildly hypermetabolic adenopathy. Focal airspace opacity or nodule in the right upper lobe not hypermetabolic. EBUS of 4R LN revealed no malignancy and reactive lymphocytes. ACE level was within normal limits at 44. TSH 3.84. LFTs mildly elevated. Hepatitis panel was negative. ESR 6. Rheumatoid factor was within normal limits. ANA screen was negative. c-ANCA and p-ANCA negative. She was DCed on home O2.   Cardiac cath in sept of 2012 showed no obstructive coronary  disease and EF 50-55. LVEDP 14.   Right heart cath cath 05/02/11 >>  RA = 13  RV = 89/8/13  PA = 92/26 (50)  PCW = 16  Fick cardiac output/index = 5.8/2.6  PVR = 5.9 Woods  FA sat = 96%  PA sat = 68%, 70%  RV sat = 65%  RA sat = 70%  High SVC sat = 65%  Chest CT in 12/12: showed some bronchietasis PSG 2/13 showed hypoxemia without resp events - Increase O2 to 4L/m during sleep  Poor sleep efficiency - TST only 130 mins  RDI 30/h , predom hypopneas & RERAs- - started on auto CPAP 5-12 -   Returns for f/u. After cath started on Adcirca. Felt much better but had HAs and flu-like symptoms and hot flashes. We cut dose back but still couldn't tolerate. Then developed hives and we stopped.  Has been off for about 3 weeks. Starting to feel worse again. Now dyspneic with any activity. Two weeks ago said she was walking back from car and she got dizzy and had a syncopal episode. Has not happened again. Feels ok at rest. No orthopnea or PND.  Uses 2-3 L of O2 continuously. Wearing CPAP. Weight stable.       Objective:   Physical Exam Sat 91-92% at rest on 3L. Sat goes to 84% on walking with 4L Gen. Pleasant, obese, in no distress, normal affect ENT - no lesions, no post nasal drip, class 2-3 airway Neck: Unable to see JVD, no thyromegaly, no carotid bruits Lungs: no use of accessory muscles, no dullness to percussion, decreased without rales or rhonchi  Cardiovascular: Rhythm regular, heart sounds  normal, no murmurs or gallops, no peripheral edema. No TR. No RV lift. P2 increased Abdomen: obese. soft and non-tender, no hepatosplenomegaly, BS normal. Musculoskeletal: No deformities, no cyanosis or clubbing Neuro:  alert, non focal, no tremors       Assessment & Plan:

## 2011-11-05 ENCOUNTER — Ambulatory Visit (HOSPITAL_COMMUNITY)
Admit: 2011-11-05 | Discharge: 2011-11-05 | Disposition: A | Discharge status: Home / Self Care | Payer: Self-pay | Attending: Internal Medicine | Admitting: Internal Medicine

## 2011-11-05 ENCOUNTER — Ambulatory Visit (HOSPITAL_COMMUNITY)
Admission: RE | Admit: 2011-11-05 | Discharge: 2011-11-05 | Disposition: A | Discharge status: Home / Self Care | Payer: Self-pay | Source: Ambulatory Visit | Attending: Internal Medicine | Admitting: Internal Medicine

## 2011-11-05 DIAGNOSIS — I10 Essential (primary) hypertension: Secondary | ICD-10-CM | POA: Insufficient documentation

## 2011-11-05 DIAGNOSIS — I059 Rheumatic mitral valve disease, unspecified: Secondary | ICD-10-CM | POA: Insufficient documentation

## 2011-11-05 DIAGNOSIS — I272 Pulmonary hypertension, unspecified: Secondary | ICD-10-CM

## 2011-11-05 DIAGNOSIS — R0609 Other forms of dyspnea: Secondary | ICD-10-CM

## 2011-11-05 DIAGNOSIS — I379 Nonrheumatic pulmonary valve disorder, unspecified: Secondary | ICD-10-CM | POA: Insufficient documentation

## 2011-11-05 DIAGNOSIS — I270 Primary pulmonary hypertension: Secondary | ICD-10-CM | POA: Insufficient documentation

## 2011-11-05 DIAGNOSIS — I2789 Other specified pulmonary heart diseases: Secondary | ICD-10-CM | POA: Insufficient documentation

## 2011-11-05 DIAGNOSIS — I369 Nonrheumatic tricuspid valve disorder, unspecified: Secondary | ICD-10-CM

## 2011-11-05 DIAGNOSIS — R0989 Other specified symptoms and signs involving the circulatory and respiratory systems: Secondary | ICD-10-CM | POA: Insufficient documentation

## 2011-11-05 DIAGNOSIS — I079 Rheumatic tricuspid valve disease, unspecified: Secondary | ICD-10-CM | POA: Insufficient documentation

## 2011-11-05 MED ORDER — TECHNETIUM TO 99M ALBUMIN AGGREGATED
3.0000 | Freq: Once | INTRAVENOUS | Status: AC | PRN
  Administered 2011-11-05: 3 via INTRAVENOUS

## 2011-11-05 NOTE — Progress Notes (Signed)
  Echocardiogram 2D Echocardiogram has been performed.  Annette Duncan 11/05/2011, 10:45 AM

## 2011-11-11 ENCOUNTER — Telehealth (HOSPITAL_COMMUNITY): Payer: Self-pay | Admitting: Cardiology

## 2011-11-11 NOTE — Telephone Encounter (Signed)
Pt would like to know if Dr.Bensimhon had any objections to pt beginning to use a NutriBullet (blender/mixer)? Pt seen a warning that stated people that use hypertension medication should contact provider prior to use as it could have an effect on b//p meds. Pt would like to begin use to help her feel better, eat better and maybe even encourage weight loss/excercise.  Please advise

## 2011-11-12 NOTE — Telephone Encounter (Signed)
Left message to call back  

## 2011-11-12 NOTE — Telephone Encounter (Signed)
Spoke w/pt. °

## 2011-11-28 ENCOUNTER — Encounter (HOSPITAL_COMMUNITY): Payer: Self-pay

## 2011-12-09 ENCOUNTER — Telehealth (HOSPITAL_COMMUNITY): Payer: Self-pay | Admitting: Cardiology

## 2011-12-09 NOTE — Telephone Encounter (Signed)
Will discuss w/Dr Bensimhon and call her tomorrow

## 2011-12-09 NOTE — Telephone Encounter (Signed)
Pt started Traclear for PHTN. One of the side effects is swelling. Pt does have c/o mild LE edma (ankles).   Pt is taking HCTZ 25mg  daily.  Just wanted to let us know and see if something different should be done.

## 2011-12-10 MED ORDER — FUROSEMIDE 20 MG PO TABS: 20.0000 mg | ORAL_TABLET | Freq: Every day | ORAL | Status: DC

## 2011-12-10 MED ORDER — POTASSIUM CHLORIDE CRYS ER 20 MEQ PO TBCR: 20.0000 meq | EXTENDED_RELEASE_TABLET | Freq: Every day | ORAL | Status: DC

## 2011-12-10 NOTE — Telephone Encounter (Signed)
Per Dr Gala Romney stop HCTZ and start pt on lasix 20 and kdur 20, pt is aware and agreeable, rx sent in

## 2011-12-26 ENCOUNTER — Telehealth (HOSPITAL_COMMUNITY): Payer: Self-pay | Admitting: Cardiology

## 2011-12-26 NOTE — Telephone Encounter (Signed)
Would like to make sure its ok to send ok pts. Triclear meds. Normally pt has to be compliant with having LFT's checked.  Please call with verbal

## 2011-12-27 ENCOUNTER — Other Ambulatory Visit: Payer: Self-pay | Admitting: Cardiology

## 2011-12-29 NOTE — Telephone Encounter (Signed)
..   Requested Prescriptions   Pending Prescriptions Disp Refills  . atenolol (TENORMIN) 50 MG tablet [Pharmacy Med Name: ATENOLOL 50 MG TABLET] 90 tablet 8    Sig: TAKE 1 & 1/2 TABLET BY MOUTH TWICE A DAY

## 2011-12-29 NOTE — Telephone Encounter (Signed)
Spoke w/[facility], they will ship med

## 2012-01-02 ENCOUNTER — Ambulatory Visit (HOSPITAL_COMMUNITY)
Admit: 2012-01-02 | Discharge: 2012-01-02 | Disposition: A | Discharge status: Home / Self Care | Payer: Self-pay | Attending: Internal Medicine | Admitting: Internal Medicine

## 2012-01-02 ENCOUNTER — Encounter (HOSPITAL_COMMUNITY): Payer: Self-pay

## 2012-01-02 VITALS — BP 112/78 | HR 87 | Ht 68.0 in | Wt 258.1 lb

## 2012-01-02 DIAGNOSIS — I272 Pulmonary hypertension, unspecified: Secondary | ICD-10-CM

## 2012-01-02 DIAGNOSIS — I2789 Other specified pulmonary heart diseases: Secondary | ICD-10-CM | POA: Insufficient documentation

## 2012-01-02 LAB — COMPREHENSIVE METABOLIC PANEL
BUN: 19 mg/dL (ref 6–23)
Calcium: 9.8 mg/dL (ref 8.4–10.5)
GFR calc Af Amer: 65 mL/min — ABNORMAL LOW (ref >90)
Glucose, Bld: 90 mg/dL (ref 70–99)
Sodium: 140 mEq/L (ref 135–145)
Total Protein: 7.3 g/dL (ref 6.0–8.3)

## 2012-01-02 MED ORDER — BOSENTAN 125 MG PO TABS: 125.0000 mg | ORAL_TABLET | Freq: Two times a day (BID) | ORAL | Status: DC

## 2012-01-02 NOTE — Progress Notes (Signed)
Weight Range   Baseline proBNP     HPI: Annette Duncan is a 61 y/o woman with a h/o obesity and obstructive sleep apnea & pulmonary hypertension.   She was admitted in December of 2012 to [facility] with grade 3-4 dyspnea that was ongoing for several months. Arterial blood gas showed a pH of 7.42, PCO2 37, and PO2 54. 88% on room air. Echocardiogram showed normal LV function, impaired left ventricular diastolic relaxation, severe pulmonary hypertension with RVSP > 60 mmHg, mild tricuspid regurgitation and mild mitral regurgitation. CTA was negative for pulmonary embolus; it showed scattered interstitial infx, mild bronchiectasis and mediastinal lymphadenopathy. PET scan showed nonspecific mildly hypermetabolic adenopathy. Focal airspace opacity or nodule in the right upper lobe not hypermetabolic. EBUS of 4R LN revealed no malignancy and reactive lymphocytes. ACE level was within normal limits at 44. TSH 3.84. LFTs mildly elevated. Hepatitis panel was negative. ESR 6. Rheumatoid factor was within normal limits. ANA screen was negative. c-ANCA and p-ANCA negative. She was DCed on home O2.   Cardiac cath in sept of 2012 showed no obstructive coronary disease and EF 50-55. LVEDP 14.   Right heart cath cath 05/02/11 >>  RA = 13  RV = 89/8/13  PA = 92/26 (50)  PCW = 16  Fick cardiac output/index = 5.8/2.6  PVR = 5.9 Woods  FA sat = 96%  PA sat = 68%, 70%  RV sat = 65%  RA sat = 70%  High SVC sat = 65%   Chest CT in 12/12: showed some bronchietasis  PSG 2/13 showed hypoxemia without resp events - Increase O2 to 4L/m during sleep  Poor sleep efficiency - TST only 130 mins  RDI 30/h , predom hypopneas & RERAs- - started on auto CPAP 5-12 -   11/05/11: Echo with EF 55-60%.  Ventricular septum with diastolic flattening and systolic flattening. RV mod-severely reduced.  PAPP 99 mmHg  10/31/11: VQ normal  Returns for f/u. She has been unable to tolerate adcirca. Now on bosentant 62.5 bid. Feels  breathing is some better. Had some edema. We recommended lasix but she didn't take it due to concern over side effects. Edema now under control. No orthopnea or PND. No further syncope.       ROS: All systems negative except as listed in HPI, PMH and Problem List.  Past Medical History  Diagnosis Date  . Hypertension   . Palpitations   . Pulmonary hypertension     Current Outpatient Prescriptions  Medication Sig Dispense Refill  . amLODipine (NORVASC) 10 MG tablet Take 1 tablet (10 mg total) by mouth daily.  30 tablet  11  . aspirin EC 81 MG tablet Take 1 tablet (81 mg total) by mouth daily.  150 tablet  2  . atenolol (TENORMIN) 50 MG tablet TAKE 1 & 1/2 TABLET BY MOUTH TWICE A DAY  90 tablet  8  . furosemide (LASIX) 20 MG tablet Take 1 tablet (20 mg total) by mouth daily.  30 tablet  6  . INOSITOL PO Take by mouth. Patient unsure of dosage      . NON FORMULARY Take by mouth daily. Cordyceps  3 tabs daily      . potassium chloride SA (K-DUR,KLOR-CON) 20 MEQ tablet Take 1 tablet (20 mEq total) by mouth daily.  30 tablet  6  . Ubiquinol 50 MG CAPS Take 100 mg by mouth 2 (two) times daily.          PHYSICAL EXAM: Filed  Vitals:   01/02/12 0903  BP: 112/78  Pulse: 87  Height: 5\' 8"  (1.727 m)  Weight: 117.078 kg (258 lb 1.8 oz)  SpO2: 74%   Gen. Pleasant, obese, in no distress, normal affect  ENT - no lesions, no post nasal drip, class 2-3 airway  Neck: Unable to see JVD, no thyromegaly, no carotid bruits  Lungs: no use of accessory muscles, no dullness to percussion, decreased without rales or rhonchi  Cardiovascular: Rhythm regular, heart sounds normal, no murmurs or gallops, no peripheral edema. No TR. No RV lift. P2 increased  Abdomen: obese. soft and non-tender, no hepatosplenomegaly, BS normal.  Musculoskeletal: No deformities, no cyanosis or clubbing  Neuro: alert, non focal, no tremors    ASSESSMENT & PLAN:

## 2012-01-02 NOTE — Assessment & Plan Note (Signed)
Functional status seems to be improving. Will titrate bosentan to 125 bid. Check LFTs today. Discussed with her that edema is a dose-dependent side effect of bosentan and to take lasix as needed.   See back in a month. Will need .

## 2012-01-02 NOTE — Addendum Note (Signed)
Encounter addended by: Noralee Space, RN on: 01/02/2012  9:31 AM<BR>     Documentation filed: Patient Instructions Section, Orders

## 2012-01-02 NOTE — Patient Instructions (Addendum)
Increase Tracleer to 125 mg Twice daily   Your physician recommends that you schedule a follow-up appointment in: 1 month

## 2012-01-16 ENCOUNTER — Telehealth (HOSPITAL_COMMUNITY): Payer: Self-pay | Admitting: Cardiology

## 2012-01-16 NOTE — Telephone Encounter (Signed)
Pt calls to report a possible syncope episode. Pt states she was bringing in her groceries yesterday evening and suddnley felt weak and dizzy. Pt feels this may have been caused by her new medication traclear.   Per vo Nicki Brady,PA--> ok to decrease back to previous dose and call office on Monday with an update.  Pt aware and voiced understanding

## 2012-01-19 ENCOUNTER — Telehealth (HOSPITAL_COMMUNITY): Payer: Self-pay | Admitting: Physician Assistant

## 2012-01-19 NOTE — Telephone Encounter (Signed)
Annette Duncan called today regarding fluid on board.  She states her weight has not really changed but she is complaining of cough and fluid in her chest.  No lower extremity edema.  She was suppose to be taking lasix/Kdur daily but she has not started this as she was afraid of the side effects.  Have discussed side effects with her.  She will start lasix/Kdur today.  She will call if she does not notice change in her fluid.  Discussed fluid restrictions and she voices understanding.

## 2012-02-06 ENCOUNTER — Ambulatory Visit (HOSPITAL_COMMUNITY)
Admit: 2012-02-06 | Discharge: 2012-02-06 | Disposition: A | Discharge status: Home / Self Care | Payer: Self-pay | Attending: Internal Medicine | Admitting: Internal Medicine

## 2012-02-06 ENCOUNTER — Encounter (HOSPITAL_COMMUNITY): Payer: Self-pay

## 2012-02-06 VITALS — BP 128/79 | HR 79 | Wt 259.8 lb

## 2012-02-06 DIAGNOSIS — I5032 Chronic diastolic (congestive) heart failure: Secondary | ICD-10-CM | POA: Insufficient documentation

## 2012-02-06 DIAGNOSIS — I272 Pulmonary hypertension, unspecified: Secondary | ICD-10-CM

## 2012-02-06 DIAGNOSIS — I2789 Other specified pulmonary heart diseases: Secondary | ICD-10-CM | POA: Insufficient documentation

## 2012-02-06 DIAGNOSIS — R079 Chest pain, unspecified: Secondary | ICD-10-CM

## 2012-02-06 LAB — COMPREHENSIVE METABOLIC PANEL
ALT: 16 U/L (ref 0–35)
AST: 21 U/L (ref 0–37)
CO2: 27 mEq/L (ref 19–32)
Chloride: 106 mEq/L (ref 96–112)
GFR calc non Af Amer: 70 mL/min — ABNORMAL LOW (ref >90)
Sodium: 140 mEq/L (ref 135–145)
Total Bilirubin: 0.3 mg/dL (ref 0.3–1.2)

## 2012-02-06 MED ORDER — SILDENAFIL CITRATE 20 MG PO TABS: 20.0000 mg | ORAL_TABLET | Freq: Three times a day (TID) | ORAL | Status: DC

## 2012-02-06 NOTE — Addendum Note (Signed)
Encounter addended by: Noralee Space, RN on: 02/06/2012 11:00 AM<BR>     Documentation filed: Patient Instructions Section, Orders

## 2012-02-06 NOTE — Patient Instructions (Addendum)
Stop Tracleer Start Revatio 20 mg Three times a day, the prescription has been sent to [facility] they will contact you  Your physician recommends that you schedule a follow-up appointment in: 6 weeks

## 2012-02-06 NOTE — Assessment & Plan Note (Signed)
Mild volume overload. Will switch lasix back to HCTZ at her request.

## 2012-02-06 NOTE — Progress Notes (Signed)
Patient ID: Annette Duncan, female   DOB: 13-Nov-1950, 61 y.o.   MRN: [id number] Pulmonologist: Dr Vassie Loll          HPI: [name] is a 61 y/o woman with a h/o obesity and obstructive sleep apnea & pulmonary hypertension. She is unable to tolerate Adcirca due to hives and flu like symptoms.   She was admitted in December of 2012 to [facility] with severe dyspnea that was ongoing for several months. Arterial blood gas showed a pH of 7.42, PCO2 37, and PO2 54. 88% on room air. Echocardiogram showed normal LV function, impaired left ventricular diastolic relaxation, severe pulmonary hypertension with RVSP > 60 mmHg, mild tricuspid regurgitation and mild mitral regurgitation. CTA was negative for pulmonary embolus; it showed scattered interstitial infx, mild bronchiectasis and mediastinal lymphadenopathy. PET scan showed nonspecific mildly hypermetabolic adenopathy. Focal airspace opacity or nodule in the right upper lobe not hypermetabolic. EBUS of 4R LN revealed no malignancy and reactive lymphocytes. ACE level was within normal limits at 44. TSH 3.84. LFTs mildly elevated. Hepatitis panel was negative. ESR 6. Rheumatoid factor was within normal limits. ANA screen was negative. c-ANCA and p-ANCA negative. She was DCed on home O2.   Cardiac cath in sept of 2012 showed no obstructive coronary disease and EF 50-55. LVEDP 14.   Right heart cath cath 05/02/11 >>  RA = 13  RV = 89/8/13  PA = 92/26 (50)  PCW = 16  Fick cardiac output/index = 5.8/2.6  PVR = 5.9 Woods  FA sat = 96%  PA sat = 68%, 70%  RV sat = 65%  RA sat = 70%  High SVC sat = 65%   Chest CT in 12/12: showed some bronchietasis  PSG 2/13 showed hypoxemia without resp events - Increase O2 to 4L/m during sleep  Poor sleep efficiency - TST only 130 mins  RDI 30/h , predom hypopneas & RERAs- - started on auto CPAP 5-12 -   11/05/11: Echo with EF 55-60%.  Ventricular septum with diastolic flattening and systolic flattening. RV mod-severely  reduced.  PAPP 99 mmHg  10/31/11: VQ normal  She returns for follow up. Stopped adcirca in July 2013 due to side effects (hives and flu like symptoms). Said she felt much better on Adcirca but just couldn't the side effects. Bosentan intialy started 12/04/11. Last visit  bosentan increased to 125 bid. Feels terrible. Functional decline. Complains of chest tightness when ambulating, dizziness and palpitations. Says lasix doesn't work as well as HCTZ.   She is only able perform ADLs. She says her Mom prepares her meals.  Increased dyspnea with exertion.  Poor endurance - says she can only walk a few feet before having to stop. She does not weigh at home. Complaint with medications. Wears 3 liters [address] which is increased to 6 liters when she walks, and uses CPAP at night.    ROS: All systems negative except as listed in HPI, PMH and Problem List.  Past Medical History  Diagnosis Date  . Hypertension   . Palpitations   . Pulmonary hypertension     Current Outpatient Prescriptions  Medication Sig Dispense Refill  . amLODipine (NORVASC) 10 MG tablet Take 1 tablet (10 mg total) by mouth daily.  30 tablet  11  . aspirin EC 81 MG tablet Take 1 tablet (81 mg total) by mouth daily.  150 tablet  2  . atenolol (TENORMIN) 50 MG tablet TAKE 1 & 1/2 TABLET BY MOUTH TWICE A DAY  90  tablet  8  . bosentan (TRACLEER) 125 MG tablet Take 1 tablet (125 mg total) by mouth 2 (two) times daily.  60 tablet  6  . furosemide (LASIX) 20 MG tablet Take 1 tablet (20 mg total) by mouth daily.  30 tablet  6  . INOSITOL PO Take by mouth. Patient unsure of dosage      . NON FORMULARY Take by mouth daily. Cordyceps  3 tabs daily      . potassium chloride SA (K-DUR,KLOR-CON) 20 MEQ tablet Take 1 tablet (20 mEq total) by mouth daily.  30 tablet  6  . Ubiquinol 50 MG CAPS Take 100 mg by mouth 2 (two) times daily.          PHYSICAL EXAM: Filed Vitals:   02/06/12 0916  BP: 128/79  Pulse: 79  Weight: 259 lb 12.8 oz (117.845 kg)    SpO2: 86%   Gen. Pleasant, obese, in no distress, normal affect  ENT - no lesions, no post nasal drip, class 2-3 airway  Neck: Unable to see JVD, no thyromegaly, no carotid bruits  Lungs: no use of accessory muscles, no dullness to percussion, decreased without rales or rhonchi  3 liters [place] Cardiovascular: Rhythm regular, heart sounds normal, no murmurs or gallops, no peripheral edema. No TR. No RV lift. P2 increased  Abdomen: obese. soft and non-tender, no hepatosplenomegaly, BS normal.  Musculoskeletal: No deformities, no cyanosis or clubbing  R and LLE 1+ edema.  Neuro: alert, non focal, no tremors    ASSESSMENT & PLAN:

## 2012-02-06 NOTE — Assessment & Plan Note (Addendum)
Difficult situation. She has mod to severe PAH and is having trouble tolerating several classes of medicines. She apparently felt much on Adcirca but couldn't tolerate due to side effects. She wants to stop Tracleer as well as she doesn't think it is helping and has severe side effects for her. We discussed 3 options 1) Switch bosentan to Revatio, 2) Switch bosentan to rocioguat or 3) Switch bosentan to inhaled iloprost. She prefers to try Revatio 20 tid. Watch very closely for hives. Call us if problem.

## 2012-02-08 NOTE — Addendum Note (Signed)
Encounter addended by: Cresenciano Genre on: 02/08/2012 10:29 AM<BR>     Documentation filed: Charges VN

## 2012-02-25 ENCOUNTER — Other Ambulatory Visit: Payer: Self-pay | Admitting: Cardiology

## 2012-03-19 ENCOUNTER — Encounter (HOSPITAL_COMMUNITY): Payer: Self-pay

## 2012-04-02 ENCOUNTER — Encounter (HOSPITAL_COMMUNITY): Payer: Self-pay

## 2012-04-16 ENCOUNTER — Telehealth (HOSPITAL_COMMUNITY): Payer: Self-pay | Admitting: Cardiology

## 2012-04-16 NOTE — Telephone Encounter (Signed)
?  med reaction. Started Revaito on 04/09/12. Reactions started with HA, since have subsided Since then pt has noticed a lot of LE edema x 3-4 days. Does not check weight daily (No Scale). Would really like to continue med as it has helped her so much but the edema is annoying.  Please advise

## 2012-04-16 NOTE — Telephone Encounter (Signed)
Spoke w/pt she is not taking Furosemide and doesn't want to take it she does take her HCTZ daily, she reports no edema in AM but by afternoon legs and feet are swollen, discussed salt and fluid intake and she will monitor edema and let me know if not getting better

## 2012-04-16 NOTE — Telephone Encounter (Signed)
Left message to call back  

## 2012-04-29 ENCOUNTER — Telehealth: Payer: Self-pay | Admitting: Cardiology

## 2012-04-29 MED ORDER — AMLODIPINE BESYLATE 10 MG PO TABS: 10.0000 mg | ORAL_TABLET | Freq: Every day | ORAL | Status: DC

## 2012-04-29 NOTE — Telephone Encounter (Signed)
New Problem:    Called in wanting to know if the patient needed to continue taking her amLODipine (NORVASC) 10 MG tablet.  Please call back.

## 2012-04-29 NOTE — Telephone Encounter (Signed)
Pt is followed in the Heart failure clinic will forward for their review. I do not see where amlodipine was stopped.

## 2012-04-29 NOTE — Telephone Encounter (Signed)
Pt is on amlodipine and it has never been stopped, rx sent into pharmacy

## 2012-04-29 NOTE — Telephone Encounter (Signed)
Pt calling also asking about whether she is to take amlodipine anymore  [phone], if not wants to know who took her off and when

## 2012-05-15 ENCOUNTER — Other Ambulatory Visit: Payer: Self-pay | Admitting: Physician Assistant

## 2012-05-15 ENCOUNTER — Telehealth: Payer: Self-pay | Admitting: Physician Assistant

## 2012-05-15 NOTE — Telephone Encounter (Signed)
Pt called because she was weak and has significant DOE. She was just hospitalized at [facility] and got a cath. When released, they told her to continue the Revatio but she cannot find the med. She says she had it but has had illness plus social issues and she cannot find the med. She is currently staying with her mother and has looked in her room. She is very concerned she cannot find the med and is afraid she will get more SOB if she does not have it.  Spoke with DB. Since not able to get Revatio on the weekends, OK to offer her sildenafil 100 mg, take 1/4th tab TID. If she is willing to cut tabs in fourths, will be the cheapest way to get the med she needs. Contacted pt and she was willing to get 4 pills. Called local pharmacy and they will fill. She is to call back for further concerns/problems.

## 2012-05-18 ENCOUNTER — Telehealth (HOSPITAL_COMMUNITY): Payer: Self-pay | Admitting: *Deleted

## 2012-05-18 MED ORDER — SILDENAFIL CITRATE 20 MG PO TABS: 40.0000 mg | ORAL_TABLET | Freq: Three times a day (TID) | ORAL | Status: DC

## 2012-05-18 NOTE — Telephone Encounter (Signed)
Pt called to discuss her Revatio, she states when she first started taking it she felt much better but lately she has been feeling bad again, discussed with Dr Gala Romney he would like for pt to increase dose to 40 mg TID, pt is aware, new prescription faxed to [facility] program at [phone]

## 2012-06-25 ENCOUNTER — Ambulatory Visit (HOSPITAL_COMMUNITY)
Admission: RE | Admit: 2012-06-25 | Discharge: 2012-06-25 | Disposition: A | Discharge status: Home / Self Care | Payer: BC Managed Care – PPO | Source: Ambulatory Visit | Attending: Internal Medicine | Admitting: Internal Medicine

## 2012-06-25 ENCOUNTER — Encounter (HOSPITAL_COMMUNITY): Payer: Self-pay | Admitting: Internal Medicine

## 2012-06-25 VITALS — BP 146/90 | HR 100 | Wt 255.5 lb

## 2012-06-25 DIAGNOSIS — I5032 Chronic diastolic (congestive) heart failure: Secondary | ICD-10-CM

## 2012-06-25 DIAGNOSIS — I272 Pulmonary hypertension, unspecified: Secondary | ICD-10-CM

## 2012-06-25 DIAGNOSIS — I10 Essential (primary) hypertension: Secondary | ICD-10-CM | POA: Insufficient documentation

## 2012-06-25 DIAGNOSIS — I2789 Other specified pulmonary heart diseases: Secondary | ICD-10-CM | POA: Insufficient documentation

## 2012-06-25 MED ORDER — SILDENAFIL CITRATE 20 MG PO TABS: 60.0000 mg | ORAL_TABLET | Freq: Three times a day (TID) | ORAL | Status: DC

## 2012-06-25 MED ORDER — AMLODIPINE BESYLATE 5 MG PO TABS: 5.0000 mg | ORAL_TABLET | Freq: Every day | ORAL | Status: DC

## 2012-06-25 NOTE — Addendum Note (Signed)
Encounter addended by: Noralee Space, RN on: 06/25/2012 10:10 AM<BR>     Documentation filed: Patient Instructions Section, Orders

## 2012-06-25 NOTE — Patient Instructions (Addendum)
Start Amlodipine 5 mg daily  Increase Revatio to 60 mg (3 tabs) Three times a day   Your physician recommends that you schedule a follow-up appointment in: 2 months

## 2012-06-25 NOTE — Progress Notes (Addendum)
Patient ID: Annette Duncan, female   DOB: 1950/09/25, 62 y.o.   MRN: [id number] Pulmonologist: Dr Vassie Loll          HPI: Annette Duncan is a 62 y/o woman with a h/o obesity and obstructive sleep apnea & pulmonary hypertension. She is unable to tolerate Adcirca due to hives and flu like symptoms.   She was admitted in December of 2012 to [facility] with severe dyspnea that was ongoing for several months. Arterial blood gas showed a pH of 7.42, PCO2 37, and PO2 54. 88% on room air. Echocardiogram showed normal LV function, impaired left ventricular diastolic relaxation, severe pulmonary hypertension with RVSP > 60 mmHg, mild tricuspid regurgitation and mild mitral regurgitation. CTA was negative for pulmonary embolus; it showed scattered interstitial infx, mild bronchiectasis and mediastinal lymphadenopathy. PET scan showed nonspecific mildly hypermetabolic adenopathy. Focal airspace opacity or nodule in the right upper lobe not hypermetabolic. EBUS of 4R LN revealed no malignancy and reactive lymphocytes. ACE level was within normal limits at 44. TSH 3.84. LFTs mildly elevated. Hepatitis panel was negative. ESR 6. Rheumatoid factor was within normal limits. ANA screen was negative. c-ANCA and p-ANCA negative. She was DCed on home O2.   Cardiac cath in sept of 2012 showed no obstructive coronary disease and EF 50-55. LVEDP 14.   Right heart cath cath 05/02/11 >>  RA = 13  RV = 89/8/13  PA = 92/26 (50)  PCW = 16  Fick cardiac output/index = 5.8/2.6  PVR = 5.9 Woods  FA sat = 96%  PA sat = 68%, 70%  RV sat = 65%  RA sat = 70%  High SVC sat = 65%   Chest CT in 12/12: showed some bronchietasis  PSG 2/13 showed hypoxemia without resp events - Increase O2 to 4L/m during sleep  Poor sleep efficiency - TST only 130 mins  RDI 30/h , predom hypopneas & RERAs- - started on auto CPAP 5-12 -   11/05/11: Echo with EF 55-60%.  Ventricular septum with diastolic flattening and systolic flattening. RV mod-severely  reduced.  PAPP 99 mmHg  10/31/11: VQ normal  Admitted to [facility] in early February due to chest pain. Latter transferred to [facility]. BP was low and atenolol stopped. She was placed on Metoprolol which she now stopped. Had R and LHC. Coronaries normal. We have asked for the RHC numbers.   She returns for follow up. Last revatio increased to 40 mg tid. On 3 liters [place] continuously. Feels "great". She is able to perform ADLs. Hasn't tried to do much more. Complaint with medications. Continues on CPAP. Weight at home 255 pounds. Following low salt diet.       ROS: All systems negative except as listed in HPI, PMH and Problem List.  Past Medical History  Diagnosis Date  . Hypertension   . Palpitations   . Pulmonary hypertension     Current Outpatient Prescriptions  Medication Sig Dispense Refill  . aspirin EC 81 MG tablet Take 1 tablet (81 mg total) by mouth daily.  150 tablet  2  . Cholecalciferol (VITAMIN D-3 PO) Take 1 capsule by mouth daily.      . hydrochlorothiazide (HYDRODIURIL) 25 MG tablet TAKE 1 TABLET EVERY DAY  90 tablet  1  . INOSITOL PO Take by mouth. Patient unsure of dosage      . NON FORMULARY Take by mouth daily. Cordyceps  3 tabs daily      . sildenafil (REVATIO) 20 MG tablet  Take 2 tablets (40 mg total) by mouth 3 (three) times daily.  540 tablet  1  . Ubiquinol 50 MG CAPS Take 100 mg by mouth 2 (two) times daily.        No current facility-administered medications for this encounter.     PHYSICAL EXAM: Filed Vitals:   06/25/12 0910  BP: 146/90  Pulse: 100  Weight: 255 lb 8 oz (115.894 kg)  SpO2: 93%   Gen. Pleasant, obese, in no distress, normal affect  ENT - no lesions, no post nasal drip, class 3 airway  Neck: Unable to see JVD, no thyromegaly, no carotid bruits  Lungs: no use of accessory muscles, no dullness to percussion, decreased without rales or rhonchi  3 liters [place] Cardiovascular: Tachy. Rhythm regular, heart sounds normal,  no murmurs or gallops, no peripheral edema. No TR. No RV lift. P2 increased  Abdomen: obese. soft and non-tender, no hepatosplenomegaly, BS normal.  Musculoskeletal: No deformities, no cyanosis or clubbing  R and LLE tr-1+ edema.  Neuro: alert, non focal, no tremors    ASSESSMENT & PLAN:

## 2012-06-25 NOTE — Assessment & Plan Note (Signed)
BP up. Will resume amlodipine.

## 2012-06-25 NOTE — Addendum Note (Signed)
Encounter addended by: Dolores Patty, MD on: 06/25/2012 10:05 AM<BR>     Documentation filed: Notes Section, Vitals Section

## 2012-06-25 NOTE — Assessment & Plan Note (Signed)
Volume status looks good. May need more diuretic at times.

## 2012-06-25 NOTE — Assessment & Plan Note (Signed)
Symptomatically improved. Still NYHA III. Increase Revatio to 60 tid. Will get RHC numbers from [facility]. Has previously failed Tracleer and Adcirca due to side effects.

## 2012-07-21 ENCOUNTER — Encounter (HOSPITAL_COMMUNITY): Payer: Self-pay | Admitting: *Deleted

## 2012-07-24 IMAGING — CR DG CHEST 2V
2 series · 2 of 2 positions shown · non-contrast
Comparison: None.

CLINICAL DATA: Shortness of breath on exertion, cough

CHEST - 2 VIEW

[view not recorded (1 of 2)]
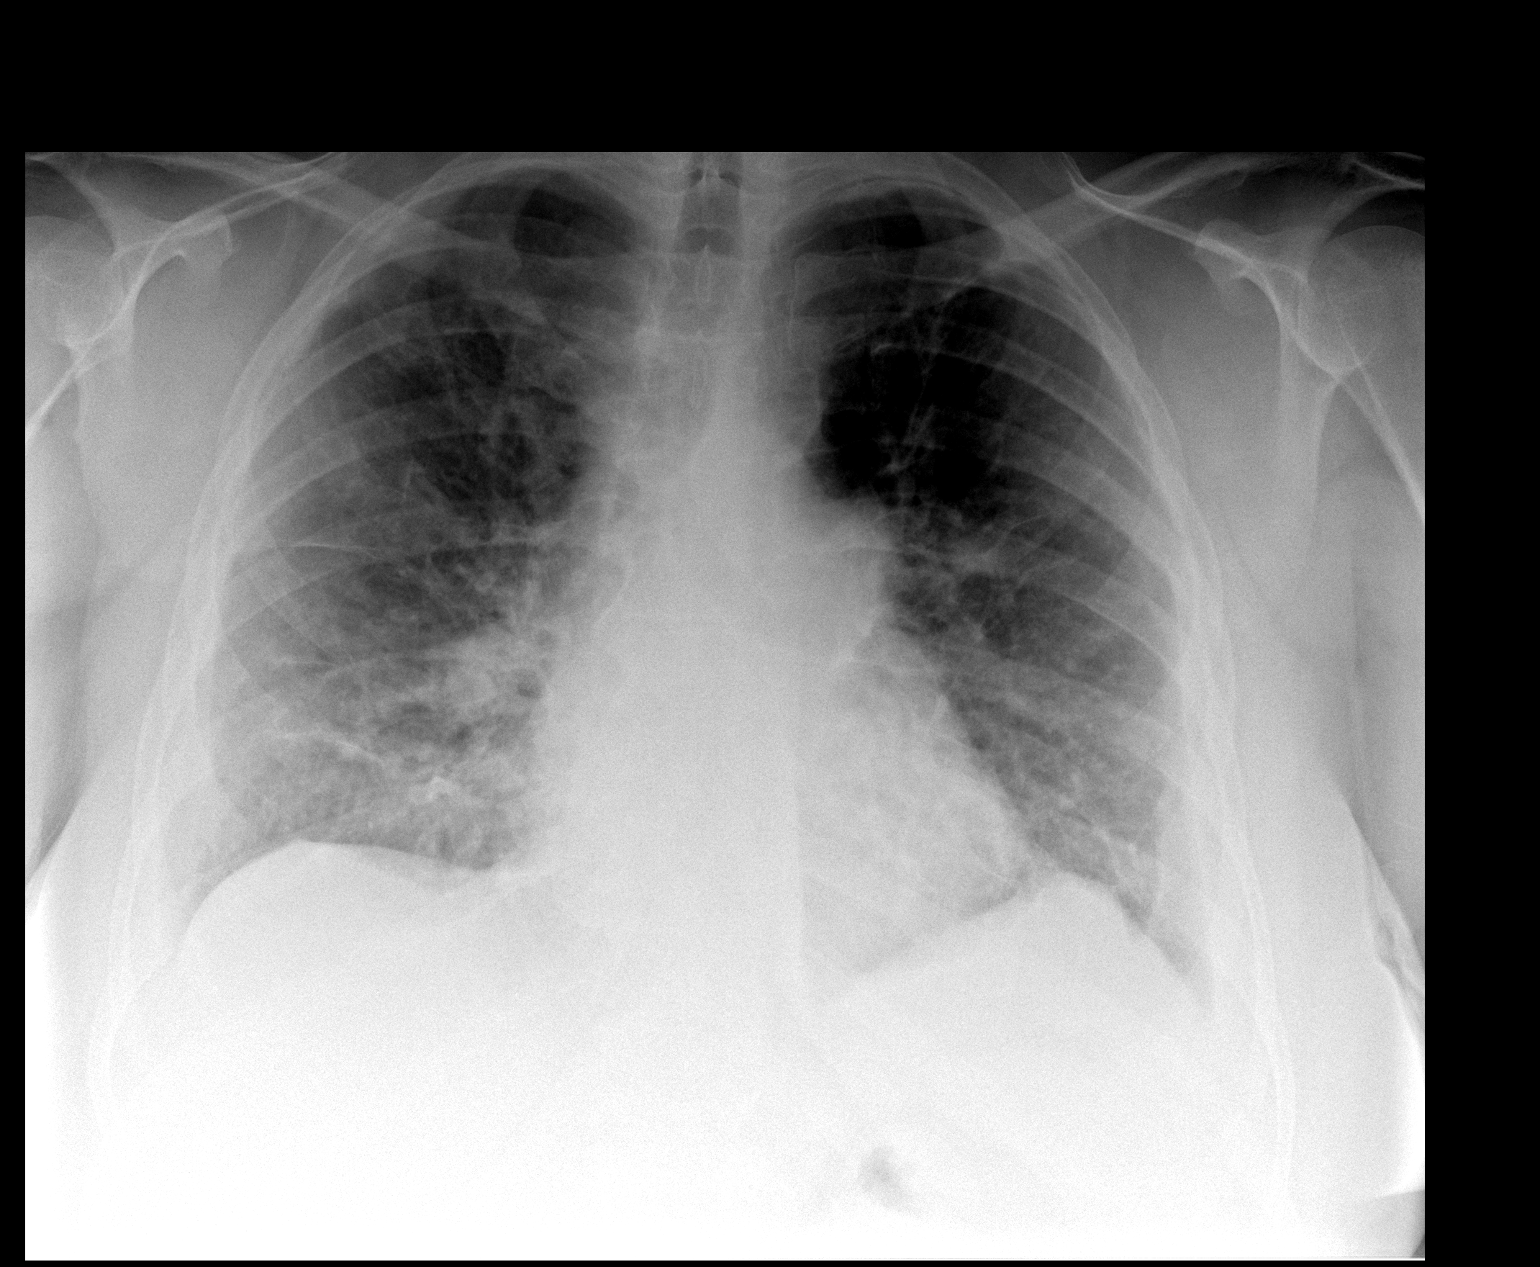

[view not recorded (2 of 2)]
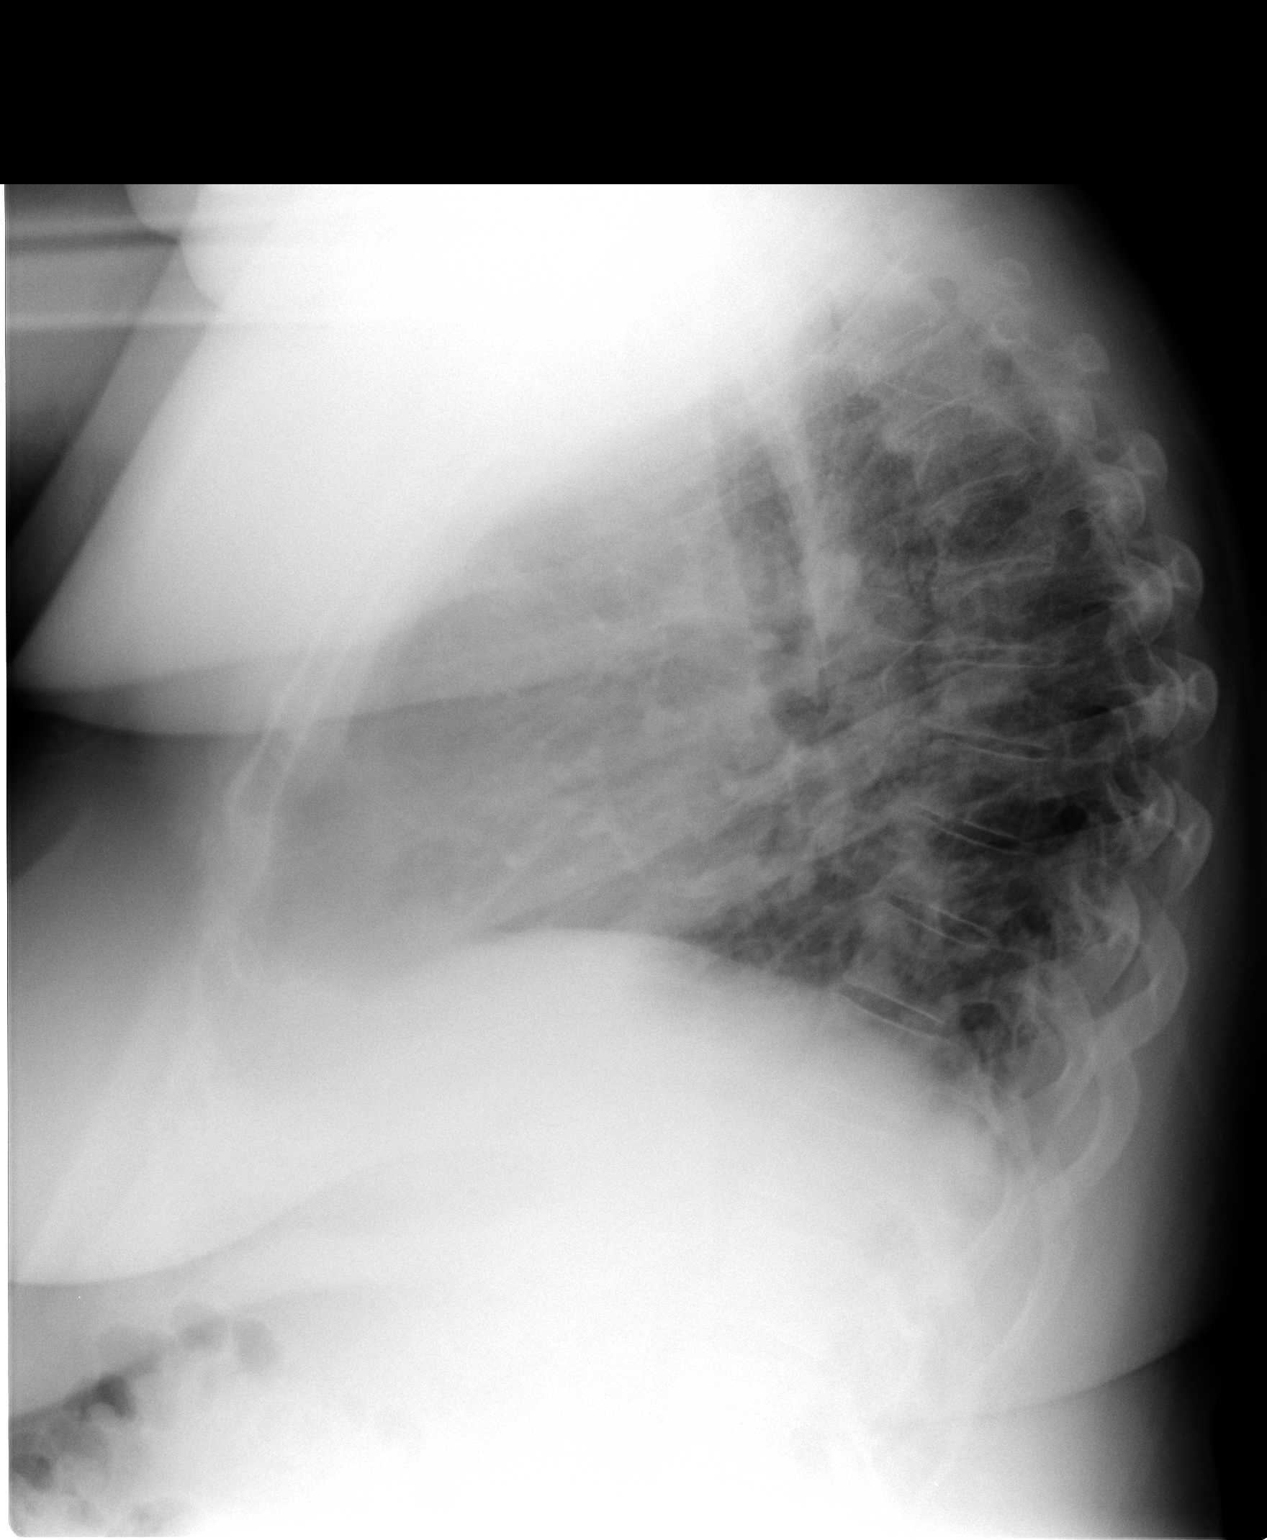

[2 of 2 positions shown; findings below may reference images not displayed]

FINDINGS: There are prominent interstitial markings throughout the
lungs which may be chronic in nature.  If a prior chest x-ray
exists, direct comparison is recommended.  An active process  would
be difficult to exclude but no focal infiltrate is seen.  No
effusion is noted.  Mediastinal contours appear grossly normal and
the heart is within upper limits of normal in size.  Only mild
degenerative change is present throughout the thoracic spine.
IMPRESSION: Somewhat prominent interstitial markings may well be chronic in
nature but an active process is difficult to exclude.  Recommend
either comparison with prior chest x-ray if available versus follow-
up chest x-ray to ensure stability.

## 2012-07-28 ENCOUNTER — Encounter: Payer: Self-pay | Admitting: Internal Medicine

## 2012-08-27 ENCOUNTER — Ambulatory Visit (HOSPITAL_COMMUNITY): Admission: RE | Admit: 2012-08-27 | Payer: BC Managed Care – PPO | Source: Ambulatory Visit

## 2012-09-06 ENCOUNTER — Other Ambulatory Visit: Payer: Self-pay | Admitting: Cardiology

## 2013-01-05 ENCOUNTER — Other Ambulatory Visit (HOSPITAL_COMMUNITY): Payer: Self-pay | Admitting: Cardiology

## 2013-01-05 DIAGNOSIS — I2789 Other specified pulmonary heart diseases: Secondary | ICD-10-CM

## 2013-01-05 MED ORDER — SILDENAFIL CITRATE 20 MG PO TABS: 60.0000 mg | ORAL_TABLET | Freq: Three times a day (TID) | ORAL | Status: DC

## 2013-01-05 NOTE — Telephone Encounter (Signed)
Pt called to request a refill on  Requested Prescriptions   Signed Prescriptions Disp Refills  . sildenafil (REVATIO) 20 MG tablet 810 tablet 1    Sig: Take 3 tablets (60 mg total) by mouth 3 (three) times daily.    Authorizing Provider: Dolores Patty    Ordering User: Lorilei Horan, Milagros Reap     And follow up appt given. At the pts request on 02/25/13 (decreased transportation)

## 2013-01-08 ENCOUNTER — Other Ambulatory Visit (HOSPITAL_COMMUNITY): Payer: Self-pay | Admitting: Internal Medicine

## 2013-02-25 ENCOUNTER — Encounter (HOSPITAL_COMMUNITY): Payer: BC Managed Care – PPO

## 2013-03-09 ENCOUNTER — Other Ambulatory Visit: Payer: Self-pay | Admitting: Cardiology

## 2013-04-11 LAB — BASIC METABOLIC PANEL
BUN: 25 mg/dL — AB (ref 4–21)
Creatinine: 1.2 mg/dL — AB (ref 0.5–1.1)
GLUCOSE: 90 mg/dL
POTASSIUM: 3.8 mmol/L (ref 3.4–5.3)
SODIUM: 138 mmol/L (ref 137–147)

## 2013-04-11 LAB — CHLORIDE
ANION GAP: 9
CO2: 30 mmol/L
Calcium: 9 mg/dL
Chloride: 99 mmol/L
GFR calc Af Amer: 55

## 2013-04-13 ENCOUNTER — Encounter: Payer: Self-pay | Admitting: *Deleted

## 2013-04-15 ENCOUNTER — Encounter: Payer: Self-pay | Admitting: *Deleted

## 2013-05-04 ENCOUNTER — Other Ambulatory Visit (HOSPITAL_COMMUNITY): Payer: Self-pay | Admitting: Internal Medicine

## 2013-06-01 ENCOUNTER — Telehealth: Payer: Self-pay | Admitting: *Deleted

## 2013-06-01 NOTE — Telephone Encounter (Signed)
Dr. Jovita KussmaulJavid's ofc called and would like for you to call back in regards to pt's coag. Return call [phone] press #1.Loralee PacasBarkley, Kandace Elrod CaldwellLynetta

## 2013-06-03 NOTE — Telephone Encounter (Signed)
Pt informed has an appt on Thursday.Loralee PacasBarkley, Dayvon Dax CoeburnLynetta

## 2013-06-03 NOTE — Telephone Encounter (Signed)
Called and spoke with Dr. Frann RiderJavid. We are going to start her on coumadin and have her followup.  Call her and have her verify her appt and that she is coming in that day.

## 2013-06-06 ENCOUNTER — Ambulatory Visit: Payer: BC Managed Care – PPO | Admitting: Family Medicine

## 2013-06-09 ENCOUNTER — Ambulatory Visit (INDEPENDENT_AMBULATORY_CARE_PROVIDER_SITE_OTHER): Payer: BC Managed Care – PPO | Admitting: Family Medicine

## 2013-06-09 ENCOUNTER — Encounter: Payer: Self-pay | Admitting: Family Medicine

## 2013-06-09 VITALS — BP 120/72 | HR 116 | Ht 68.0 in | Wt 248.0 lb

## 2013-06-09 DIAGNOSIS — I2789 Other specified pulmonary heart diseases: Secondary | ICD-10-CM

## 2013-06-09 DIAGNOSIS — Z23 Encounter for immunization: Secondary | ICD-10-CM

## 2013-06-09 DIAGNOSIS — I272 Pulmonary hypertension, unspecified: Secondary | ICD-10-CM

## 2013-06-09 MED ORDER — WARFARIN SODIUM 5 MG PO TABS: 5.0000 mg | ORAL_TABLET | Freq: Every day | ORAL | Status: DC

## 2013-06-09 NOTE — Progress Notes (Signed)
   Subjective:    Patient ID: Annette RosierKathy Duncan, female    DOB: 02/15/1951, 63 y.o.   MRN: [id number]  HPI Pulmonary Hypertension - she's here to discuss starting Coumadin. Her pulmonologist has been treating her for severe pulmonary hypertension. Unfortunately her prognosis is extremely poor. They are trying several medications and she is now on chronic oxygen. He called our office asking if we would be willing to manage her Coumadin and keep her at her. Goal between 2 and 3. She's never taken medications like this before.  Hypertension- Pt denies chest pain, SOB, dizziness, or heart palpitations.  Taking meds as directed w/o problems.  Denies medication side effects.     Review of Systems     Objective:   Physical Exam  Constitutional: She is oriented to person, place, and time. She appears well-developed and well-nourished.  HENT:  Head: Normocephalic and atraumatic.  Cardiovascular: Normal rate, regular rhythm and normal heart sounds.   Pulmonary/Chest: Effort normal and breath sounds normal.  Abdominal: Bowel sounds are normal.  Neurological: She is alert and oriented to person, place, and time.  Skin: Skin is warm and dry.  Psychiatric: She has a normal mood and affect. Her behavior is normal.          Assessment & Plan:  Pulmonary hypertension-we discussed starting Coumadin. We discussed the pros and cons. Also divided handout about foods to avoid. She does love leafy green vegetables so if she wants to he then she discontinued them on a regular basis so that we can adjust her Coumadin and not sporadically. We can see how she's doing with it. We also discussed increased risk of bleeding and to call if she's noticing any blood after brushing her teeth or easy bruising. Also discussed her therapeutic INR goal between 2 and 3 and that she will be need to be monitored frequently for adjustments to the medication. I would like to see her back in 5 days after starting Coumadin checked.  Encouraged her to take her dose in the evening so that we can make daily adjustments if needed. Continue to followup with pulmonology.   Prevnar 13 vaccine given today. She will need the pneumonia 23 vaccine in 1 year.  Hypertension-well-controlled. Blood pressure looks fantastic today.

## 2013-06-09 NOTE — Patient Instructions (Signed)
Warfarin: What You Need to Know  Warfarin is an anticoagulant. Anticoagulants help prevent the formation of blood clots. They also help stop the growth of blood clots. Warfarin is sometimes referred to as a "blood thinner."   Normally, when body tissues are cut or damaged, the blood clots in order to prevent blood loss. Sometimes clots form inside your blood vessels and obstruct the flow of blood through your circulatory system (thrombosis). These clots may travel through your bloodstream and become lodged in smaller blood vessels in your brain, which can cause a stroke, or your lungs (pulmonary embolism).  WHO SHOULD USE WARFARIN?  Warfarin is prescribed for people at risk of developing harmful blood clots:  · People with surgically implanted mechanical heart valves, irregular heart rhythms called atrial fibrillation, and certain clotting disorders.  · People who have developed harmful blood clotting in the past, including those who have had a stroke or a pulmonary embolism, or thrombosis in their legs (deep vein thrombosis [DVT]).  · People with an existing blood clot such as a pulmonary embolism.  WARFARIN DOSING  Warfarin tablets come in different strengths. Each tablet strength is a different color, with the amount of warfarin (in milligrams) clearly printed on the tablet. If the color of your tablet is different than usual when you receive a new prescription, report it immediately to your pharmacist or health care provider.  WARFARIN MONITORING  The goal of warfarin therapy is to lessen the clotting tendency of blood but not to prevent clotting completely. Your health care provider will monitor the anticoagulation effect of warfarin closely and adjust your dose as needed. For your safety, blood tests called prothrombin time (PT) or international normalized ratio (INR) are used to measure the effects of warfarin. Both of these tests can be done with a finger stick or a blood draw.  The longer it takes the blood  to clot, the higher the PT or INR. Your health care provider will inform you of your "target" PT or INR range. If, at any time, your PT or INR is above the target range, there is a risk of bleeding. If your PT or INR is below the target range, there is a risk of clotting.  Whether you are started on warfarin while you are in the hospital, or in your health care provider's office, you will need to have your PT or INR checked within one week of starting the medicine. Initially, some people are asked to have their PT or INR checked as much as twice a week.  Once you are on a stable maintenance dose, the PT or INR is checked less often, usually once every 2 to 4 weeks. The warfarin dose may be adjusted if the PT or INR is not within the target range. It is important to keep all laboratory and health care provider follow-up appointments.   WHAT ARE THE SIDE EFFECTS OF WARFARIN?  · Too much warfarin can cause bleeding (hemorrhage) from any part of the body. This may include bleeding from the gums, blood in the urine, bloody or dark stools, a nosebleed that is not easily stopped, coughing up blood, or vomiting blood.  · Too little warfarin can increase the risk of blood clots.  · Too little or too much warfarin can also increase the risk of a stroke.  · Warfarin use may cause a skin rash or irritation, an unusual fever, continual nausea or stomach upset, or severe pain in your joints or back.  SPECIAL PRECAUTIONS   WHILE TAKING WARFARIN  Warfarin should be taken exactly as directed:  · Take your medicine at the same time every day. If you forget to take your dose, you can take it if it is within 6 hours of when it was due.  · Do not change the dose of warfarin on your own to make up for missed or extra doses.  · If you miss more than 2 doses in a row, you should contact your health care provider for advice.  Avoid situations that cause bleeding. You may have a tendency to bleed more easily than usual while taking warfarin.  The following actions can limit bleeding:  · Using a softer toothbrush.  · Flossing with waxed floss rather than unwaxed floss.  · Shaving with an electric razor rather than a blade.  · Limiting the use of sharp objects.  · Avoiding potentially harmful activities such as contact sports.  Warfarin and Pregnancy or Breastfeeding  · Warfarin is not advised during the first trimester of pregnancy due to an increased risk of birth defects. In certain situations, a woman may take warfarin after her first trimester of pregnancy. A woman who becomes pregnant or plans to become pregnant while taking warfarin should notify her health care provider immediately.  · Although warfarin does not pass into breast milk, a woman who wishes to breastfeed while taking warfarin should also consult with her health care provider.  Alcohol, Smoking, and Illicit Drug Use  · Alcohol affects how warfarin works in the body. It is best to avoid alcoholic drinks or consume very small amounts while taking warfarin. In general, alcohol intake should be limited to 1 oz (30 mL) of liquor, 6 oz (180 mL) of wine, or 12 oz (360 mL) of beer each day. Notify your health care provider if you change your alcohol intake.  · Smoking affects how warfarin works. It is best to avoid smoking while taking warfarin. Notify your health care provider if you change your smoking habits.  · It is best to avoid all illicit drugs while taking warfarin since there are few studies that show how warfarin interacts with these drugs.  Other Medicines and Dietary Supplements  Many prescription and over-the-counter medicines can interfere with warfarin. Be sure all of your health care providers know you are taking warfarin. Notify your health care provider who prescribed warfarin for you before starting or stopping any new medicines, including over-the-counter vitamins, dietary supplements, and pain medicines. Your warfarin dose may need to be adjusted.  Some common  over-the-counter medicines that may increase the risk of bleeding while taking warfarin include:   · Acetaminophen.  · Aspirin.  · Nonsteroidal anti-inflammatory medicines such as ibuprofen or naproxen.  · Vitamin E.  Dietary Considerations   Foods that have moderate or high amounts of vitamin K can interfere with warfarin. Avoid major changes in your diet or notify your health care provider before changing your diet. Eat a consistent amount of foods that have moderate or high amounts of vitamin K.Eating less foods containing vitamin K can increase the risk of bleeding. Eating more foods containing vitamin K can increase the risk of blood clots. Additional questions about dietary considerations can be discussed with a dietitian.  The serving size for foods containing moderate or high amounts of vitamin K are ½ cup cooked (120 mL or noted gram weight) or 1 cup raw (240 mL or noted gram weight), unless otherwise noted. These foods include:  Proteins  · Beef liver,   3.5 oz (100 g).  · Pork liver, 3.5 oz (100 g).  Legumes  · Soybean oil.  · Soybeans.  · Garbanzo beans.  · Green peas.  · Black-eyed peas.  Leafy green vegetables  · Kale.  · Spinach.  · Nettle greens.  · Swiss chard.  · Watercress.  · Endive.  · Parsley, 1 tbsp (4 g).  · Turnip greens.  · Collard greens.  · Seaweed, limit 2 sheets.  · Beet greens.  · Dandelion greens.  · Mustard greens.  · Green Lead and Romaine lettuce.  Cruciferous vegetables  · Broccoli.  · Cabbage (green or Chinese).  · Brussels sprouts.  · Cauliflower.  · Asparagus.  Miscellaneous  · Onions, green onions, or spring onions.  · Green tea made with ½ oz (14 g) or more of dried tea.  · Herbal teas containing coumarin.  · Spinach noodles.  · Okra.  · Prunes.  · Pickles.  CALL YOUR CLINIC OR HEALTH CARE PROVIDER IF YOU:  · Plan to have any surgery or procedure.  · Feel sick, especially if you have diarrhea or vomiting.  · Experience or anticipate any major changes in your diet.  · Start or  stop a prescription or over-the-counter medicine.  · Become, plan to become, or think you may be pregnant.  · Are having heavier than usual menstrual periods.  · Have had a fall, accident, or any symptoms of bleeding or unusual bruising.  · An unusual fever.  CALL 911 IN THE [place] OR GO TO THE EMERGENCY DEPARTMENT IF YOU:   · Think you may be having an allergic reaction to warfarin. The signs of an allergic reaction could include itching, rash, hives, swelling, chest tightness, or trouble breathing.  · See signs of blood in your urine. The signs could include reddish, pinkish, or tea-colored urine.  · See signs of blood in your stools. The signs could include bright red or black stools.  · Vomit or cough up blood. In these instances, the blood could have either a bright red or a "coffee-grounds" appearance.  · Have bleeding that will not stop after applying pressure for 30 minutes such as cuts, nosebleeds, other injuries.  · Have severe pain in your joints or back.  · Have a new and severe headache.  · Have sudden weakness or numbness of your face, arm, or leg, especially on one side of your body.  · Have sudden confusion or trouble understanding.  · Have sudden trouble seeing in one or both eyes.  · Have sudden trouble walking, dizziness, loss of balance, or coordination.  · Have aphasia.  Document Released: 03/10/2005 Document Revised: 12/03/2011 Document Reviewed: 09/03/2012  [employer]® Patient Information ©2014 [employer].

## 2013-06-10 ENCOUNTER — Other Ambulatory Visit: Payer: Self-pay | Admitting: Family Medicine

## 2013-06-14 ENCOUNTER — Ambulatory Visit (INDEPENDENT_AMBULATORY_CARE_PROVIDER_SITE_OTHER): Payer: BC Managed Care – PPO | Admitting: Family Medicine

## 2013-06-14 ENCOUNTER — Encounter: Payer: Self-pay | Admitting: *Deleted

## 2013-06-14 VITALS — BP 113/62 | HR 122

## 2013-06-14 DIAGNOSIS — I272 Pulmonary hypertension, unspecified: Secondary | ICD-10-CM

## 2013-06-14 DIAGNOSIS — I2789 Other specified pulmonary heart diseases: Secondary | ICD-10-CM

## 2013-06-14 LAB — POCT INR: INR: 1.2

## 2013-06-14 NOTE — Progress Notes (Signed)
   Subjective:    Patient ID: Annette RosierKathy Duncan, female    DOB: 09/08/1950, 63 y.o.   MRN: [id number] Pt notified of new dosing instructions & to recheck in one week. Donne AnonAmber Savilla Turbyfill, CMA HPI    Review of Systems     Objective:   Physical Exam        Assessment & Plan:

## 2013-06-21 ENCOUNTER — Encounter: Payer: Self-pay | Admitting: *Deleted

## 2013-06-21 ENCOUNTER — Ambulatory Visit (INDEPENDENT_AMBULATORY_CARE_PROVIDER_SITE_OTHER): Payer: BC Managed Care – PPO | Admitting: Family Medicine

## 2013-06-21 VITALS — BP 133/74 | HR 116 | Ht 68.0 in | Wt 245.0 lb

## 2013-06-21 DIAGNOSIS — I2789 Other specified pulmonary heart diseases: Secondary | ICD-10-CM

## 2013-06-21 DIAGNOSIS — I272 Pulmonary hypertension, unspecified: Secondary | ICD-10-CM

## 2013-06-21 LAB — POCT INR: INR: 1.5

## 2013-06-21 NOTE — Progress Notes (Signed)
Pt given medication dose,and f/u instructions.Loralee PacasBarkley, Britaney Espaillat [place]

## 2013-06-21 NOTE — Progress Notes (Signed)
Patient was in office for coumadin check. Patient denied any symptoms INR was 1.5. Sharion Grieves,CMA

## 2013-06-28 ENCOUNTER — Ambulatory Visit (INDEPENDENT_AMBULATORY_CARE_PROVIDER_SITE_OTHER): Payer: BC Managed Care – PPO | Admitting: Family Medicine

## 2013-06-28 ENCOUNTER — Encounter: Payer: Self-pay | Admitting: Family Medicine

## 2013-06-28 VITALS — BP 122/73 | HR 103 | Wt 241.0 lb

## 2013-06-28 DIAGNOSIS — I2789 Other specified pulmonary heart diseases: Secondary | ICD-10-CM

## 2013-06-28 DIAGNOSIS — I272 Pulmonary hypertension, unspecified: Secondary | ICD-10-CM

## 2013-06-28 DIAGNOSIS — I5032 Chronic diastolic (congestive) heart failure: Secondary | ICD-10-CM

## 2013-06-28 LAB — POCT INR: INR: 1.7

## 2013-06-28 NOTE — Progress Notes (Signed)
.  Pt here for INR check no missed doses, diet changes,bruising,bleeding,CP,SOB.Annette Duncan   

## 2013-06-28 NOTE — Progress Notes (Signed)
Results/recommendations given to patient.Annette Duncan  

## 2013-07-03 ENCOUNTER — Other Ambulatory Visit (HOSPITAL_COMMUNITY): Payer: Self-pay | Admitting: Internal Medicine

## 2013-07-08 ENCOUNTER — Ambulatory Visit (INDEPENDENT_AMBULATORY_CARE_PROVIDER_SITE_OTHER): Payer: BC Managed Care – PPO | Admitting: Family Medicine

## 2013-07-08 VITALS — BP 121/75 | HR 108 | Ht 68.0 in | Wt 239.0 lb

## 2013-07-08 DIAGNOSIS — I272 Pulmonary hypertension, unspecified: Secondary | ICD-10-CM

## 2013-07-08 DIAGNOSIS — I2789 Other specified pulmonary heart diseases: Secondary | ICD-10-CM

## 2013-07-08 LAB — POCT INR: INR: 1.8

## 2013-07-08 NOTE — Progress Notes (Signed)
Patient has been informed of new dose change for coumadin. Annette Duncan,CMA

## 2013-07-08 NOTE — Progress Notes (Signed)
Patient was in office for Coumadine check,  INR was 1.8. Patient denied anything abnormal. Annette Duncan,CMA

## 2013-07-15 ENCOUNTER — Ambulatory Visit (INDEPENDENT_AMBULATORY_CARE_PROVIDER_SITE_OTHER): Payer: BC Managed Care – PPO | Admitting: Family Medicine

## 2013-07-15 ENCOUNTER — Encounter: Payer: Self-pay | Admitting: *Deleted

## 2013-07-15 VITALS — BP 121/66 | HR 115

## 2013-07-15 DIAGNOSIS — I272 Pulmonary hypertension, unspecified: Secondary | ICD-10-CM

## 2013-07-15 DIAGNOSIS — I2789 Other specified pulmonary heart diseases: Secondary | ICD-10-CM

## 2013-07-15 LAB — POCT INR: INR: 1.8

## 2013-07-15 NOTE — Progress Notes (Signed)
   Subjective:    Patient ID: Annette RosierKathy Duncan, female    DOB: 07/23/1950, 63 y.o.   MRN: [id number] Pt notified of new dosing instructions. Annette Duncan, CMA HPI    Review of Systems     Objective:   Physical Exam        Assessment & Plan:

## 2013-07-22 ENCOUNTER — Other Ambulatory Visit: Payer: Self-pay | Admitting: *Deleted

## 2013-07-22 ENCOUNTER — Ambulatory Visit (INDEPENDENT_AMBULATORY_CARE_PROVIDER_SITE_OTHER): Payer: BC Managed Care – PPO | Admitting: Family Medicine

## 2013-07-22 ENCOUNTER — Encounter: Payer: Self-pay | Admitting: *Deleted

## 2013-07-22 VITALS — BP 125/68 | HR 108

## 2013-07-22 DIAGNOSIS — I272 Pulmonary hypertension, unspecified: Secondary | ICD-10-CM

## 2013-07-22 DIAGNOSIS — I2789 Other specified pulmonary heart diseases: Secondary | ICD-10-CM

## 2013-07-22 LAB — POCT INR: INR: 1.8

## 2013-07-22 MED ORDER — WARFARIN SODIUM 5 MG PO TABS: 5.0000 mg | ORAL_TABLET | Freq: Every day | ORAL | Status: DC

## 2013-07-22 NOTE — Progress Notes (Signed)
Pt notified of dosing change.

## 2013-07-29 ENCOUNTER — Ambulatory Visit: Payer: BC Managed Care – PPO

## 2013-08-01 ENCOUNTER — Other Ambulatory Visit (HOSPITAL_COMMUNITY): Payer: Self-pay

## 2013-08-01 DIAGNOSIS — I2789 Other specified pulmonary heart diseases: Secondary | ICD-10-CM

## 2013-08-01 MED ORDER — SILDENAFIL CITRATE 20 MG PO TABS: 60.0000 mg | ORAL_TABLET | Freq: Three times a day (TID) | ORAL | Status: AC

## 2013-08-04 ENCOUNTER — Other Ambulatory Visit (HOSPITAL_COMMUNITY): Payer: Self-pay | Admitting: Internal Medicine

## 2013-08-04 ENCOUNTER — Ambulatory Visit: Payer: BC Managed Care – PPO

## 2013-08-05 ENCOUNTER — Encounter (HOSPITAL_COMMUNITY): Payer: Self-pay | Admitting: *Deleted

## 2013-08-12 ENCOUNTER — Ambulatory Visit (INDEPENDENT_AMBULATORY_CARE_PROVIDER_SITE_OTHER): Payer: BC Managed Care – PPO | Admitting: Family Medicine

## 2013-08-12 DIAGNOSIS — I272 Pulmonary hypertension, unspecified: Secondary | ICD-10-CM

## 2013-08-12 DIAGNOSIS — I2789 Other specified pulmonary heart diseases: Secondary | ICD-10-CM

## 2013-08-12 LAB — POCT INR: INR: 2.7

## 2013-08-17 ENCOUNTER — Other Ambulatory Visit: Payer: Self-pay | Admitting: Family Medicine

## 2013-08-18 ENCOUNTER — Telehealth: Payer: Self-pay | Admitting: *Deleted

## 2013-08-18 NOTE — Telephone Encounter (Signed)
This could be as simple as a nosebleed that's draining into the back of her nasal cavity then into the mouth but with her history of pulmonary disease it would be wise to be evaluated with an office visit to determine if a chest xray is necessary.

## 2013-08-18 NOTE — Telephone Encounter (Signed)
Pt called and stated that she had noticed a blood clot in her mouth last night on her tongue. She said that she was lying down and felt something in on her tongue and wiped it with a kleenex. She stated that she was not coughing. And this morning she said that when she got up she spit out some blood. She does wear a CPAP and states that this is the first time that this has happened to her. I asked if she has had anymore episodes of this happening since this morning. No problems with swallowing. Please advise.Deno Etienne

## 2013-08-19 NOTE — Telephone Encounter (Signed)
Called and informed pt. She stated that she is not having any problems today and will call back should these issues arise again. I told her that should she begin to experience any SOB, or coughing/spitting up blood to go directly to the ED. Pt voiced understanding and agreed.  Deno Etienne

## 2013-09-13 ENCOUNTER — Other Ambulatory Visit (HOSPITAL_COMMUNITY): Payer: Self-pay | Admitting: Internal Medicine

## 2013-09-28 ENCOUNTER — Other Ambulatory Visit (HOSPITAL_COMMUNITY): Payer: Self-pay | Admitting: Internal Medicine

## 2013-10-10 ENCOUNTER — Other Ambulatory Visit: Payer: Self-pay

## 2013-10-10 MED ORDER — WARFARIN SODIUM 5 MG PO TABS: 5.0000 mg | ORAL_TABLET | Freq: Every day | ORAL | Status: DC

## 2013-10-10 MED ORDER — POTASSIUM CHLORIDE CRYS ER 20 MEQ PO TBCR: 20.0000 meq | EXTENDED_RELEASE_TABLET | Freq: Once | ORAL | Status: AC

## 2013-11-28 ENCOUNTER — Other Ambulatory Visit: Payer: Self-pay | Admitting: Family Medicine

## 2014-04-30 ENCOUNTER — Other Ambulatory Visit: Payer: Self-pay | Admitting: Family Medicine

## 2014-06-22 ENCOUNTER — Other Ambulatory Visit: Payer: Self-pay | Admitting: Family Medicine

## 2014-12-03 ENCOUNTER — Other Ambulatory Visit: Payer: Self-pay | Admitting: Family Medicine

## 2015-03-25 DEATH — deceased
